# Patient Record
Sex: Female | Born: 1950 | Race: Black or African American | Hispanic: No | Smoking: Never smoker
Health system: Southern US, Community
[De-identification: ages and names within clinical notes are randomized; demographics above are authoritative.]

## PROBLEM LIST (undated history)

## (undated) DIAGNOSIS — D860 Sarcoidosis of lung: Secondary | ICD-10-CM

## (undated) DIAGNOSIS — E669 Obesity, unspecified: Secondary | ICD-10-CM

## (undated) DIAGNOSIS — I10 Essential (primary) hypertension: Secondary | ICD-10-CM

## (undated) HISTORY — DX: Obesity, unspecified: E66.9

## (undated) HISTORY — PX: TUBAL LIGATION: SHX77

## (undated) HISTORY — DX: Essential (primary) hypertension: I10

## (undated) HISTORY — DX: Sarcoidosis of lung: D86.0

---

## 2011-02-10 LAB — HM MAMMOGRAPHY: HM Mammogram: NORMAL

## 2011-02-10 LAB — HM COLONOSCOPY: HM Colonoscopy: NORMAL

## 2012-11-21 ENCOUNTER — Ambulatory Visit (INDEPENDENT_AMBULATORY_CARE_PROVIDER_SITE_OTHER): Payer: 59 | Admitting: Family Medicine

## 2012-11-21 ENCOUNTER — Encounter: Payer: Self-pay | Admitting: Family Medicine

## 2012-11-21 VITALS — BP 126/88 | HR 89 | Ht 62.0 in | Wt 198.0 lb

## 2012-11-21 DIAGNOSIS — I10 Essential (primary) hypertension: Secondary | ICD-10-CM

## 2012-11-21 DIAGNOSIS — M25569 Pain in unspecified knee: Secondary | ICD-10-CM

## 2012-11-21 DIAGNOSIS — Z23 Encounter for immunization: Secondary | ICD-10-CM

## 2012-11-21 DIAGNOSIS — M25562 Pain in left knee: Secondary | ICD-10-CM

## 2012-11-21 DIAGNOSIS — M79671 Pain in right foot: Secondary | ICD-10-CM

## 2012-11-21 DIAGNOSIS — Z2911 Encounter for prophylactic immunotherapy for respiratory syncytial virus (RSV): Secondary | ICD-10-CM

## 2012-11-21 DIAGNOSIS — D869 Sarcoidosis, unspecified: Secondary | ICD-10-CM

## 2012-11-21 DIAGNOSIS — M79609 Pain in unspecified limb: Secondary | ICD-10-CM

## 2012-11-21 HISTORY — DX: Sarcoidosis, unspecified: D86.9

## 2012-11-21 NOTE — Progress Notes (Signed)
  Subjective:    Patient ID: Terri Fitzgerald, female    DOB: 08/31/1950, 62 y.o.   MRN: [id number]  HPI She is here for a get acquainted visit. He has a history of sarcoidosis and presently is on inhalers. She was seen card to moving here and apparently this sarcoidosis in remission. She also has a history of hypertension and is on medications for this. She does complain of right heel pain and apparently had seen her orthopedic surgeon and given orthotics of however she says is not working. She also injured her left knee approximately 3 or 4 weeks ago and it occasionally gives her difficulty especially when she flexes. She states this is getting better.   Review of Systems     Objective:   Physical Exam Alert and in no distress. Exam of the right heel shows some slight tenderness to the medial lateral aspect of the calcaneus. Exam of the left knee shows no effusion, with full motion. No laxity noted. Negative McMurray's sign.       Assessment & Plan:  Sarcoidosis  Need for prophylactic vaccination and inoculation against influenza - Plan: Flu Vaccine QUAD 36+ mos IM, Pneumococcal polysaccharide vaccine 23-valent greater than or equal to 2yo subcutaneous/IM  Need for prophylactic vaccination and inoculation against unspecified single disease - Plan: Varicella-zoster vaccine subcutaneous  Hypertension  Heel pain, right  Knee pain, left  Her immunizations were updated. She will sign a release form. Recommend she use heel cup for her heel pain and no therapy for the knee since it is getting better. Continue on her blood pressure medications.

## 2012-12-19 ENCOUNTER — Other Ambulatory Visit: Payer: Self-pay

## 2012-12-19 MED ORDER — AMLODIPINE BESYLATE 5 MG PO TABS: 5.0000 mg | ORAL_TABLET | Freq: Every day | ORAL | Status: DC

## 2012-12-19 MED ORDER — VALSARTAN-HYDROCHLOROTHIAZIDE 320-25 MG PO TABS: 1.0000 | ORAL_TABLET | Freq: Every day | ORAL | Status: DC

## 2012-12-19 NOTE — Telephone Encounter (Signed)
PT CALLED AND LEFT MESSAGE THAT SHE NEEDED REFILL ON B/P MED AND THEY WERE SENT IN

## 2013-08-04 ENCOUNTER — Encounter: Payer: Self-pay | Admitting: Family Medicine

## 2013-08-04 ENCOUNTER — Ambulatory Visit (INDEPENDENT_AMBULATORY_CARE_PROVIDER_SITE_OTHER): Payer: 59 | Admitting: Family Medicine

## 2013-08-04 VITALS — BP 102/78 | Wt 207.0 lb

## 2013-08-04 DIAGNOSIS — E669 Obesity, unspecified: Secondary | ICD-10-CM

## 2013-08-04 DIAGNOSIS — I10 Essential (primary) hypertension: Secondary | ICD-10-CM

## 2013-08-04 DIAGNOSIS — Z23 Encounter for immunization: Secondary | ICD-10-CM

## 2013-08-04 DIAGNOSIS — D869 Sarcoidosis, unspecified: Secondary | ICD-10-CM

## 2013-08-04 LAB — COMPREHENSIVE METABOLIC PANEL
ALBUMIN: 4 g/dL (ref 3.5–5.2)
ALT: 24 U/L (ref 0–35)
AST: 20 U/L (ref 0–37)
Alkaline Phosphatase: 97 U/L (ref 39–117)
BUN: 16 mg/dL (ref 6–23)
CO2: 30 mEq/L (ref 19–32)
Calcium: 9.3 mg/dL (ref 8.4–10.5)
Chloride: 101 mEq/L (ref 96–112)
Creat: 0.92 mg/dL (ref 0.50–1.10)
Glucose, Bld: 111 mg/dL — ABNORMAL HIGH (ref 70–99)
POTASSIUM: 3.4 meq/L — AB (ref 3.5–5.3)
SODIUM: 141 meq/L (ref 135–145)
TOTAL PROTEIN: 7.5 g/dL (ref 6.0–8.3)
Total Bilirubin: 0.5 mg/dL (ref 0.2–1.2)

## 2013-08-04 LAB — CBC WITH DIFFERENTIAL/PLATELET
BASOS ABS: 0 10*3/uL (ref 0.0–0.1)
Basophils Relative: 0 % (ref 0–1)
EOS ABS: 0.3 10*3/uL (ref 0.0–0.7)
Eosinophils Relative: 3 % (ref 0–5)
HCT: 39.4 % (ref 36.0–46.0)
Hemoglobin: 13.9 g/dL (ref 12.0–15.0)
Lymphocytes Relative: 44 % (ref 12–46)
Lymphs Abs: 3.8 10*3/uL (ref 0.7–4.0)
MCH: 29.6 pg (ref 26.0–34.0)
MCHC: 35.3 g/dL (ref 30.0–36.0)
MCV: 84 fL (ref 78.0–100.0)
Monocytes Absolute: 0.7 10*3/uL (ref 0.1–1.0)
Monocytes Relative: 8 % (ref 3–12)
NEUTROS ABS: 3.9 10*3/uL (ref 1.7–7.7)
Neutrophils Relative %: 45 % (ref 43–77)
Platelets: 182 10*3/uL (ref 150–400)
RBC: 4.69 MIL/uL (ref 3.87–5.11)
RDW: 14.5 % (ref 11.5–15.5)
WBC: 8.6 10*3/uL (ref 4.0–10.5)

## 2013-08-04 LAB — LIPID PANEL
Cholesterol: 217 mg/dL — ABNORMAL HIGH (ref 0–200)
HDL: 65 mg/dL (ref >39)
LDL CALC: 96 mg/dL (ref 0–99)
TRIGLYCERIDES: 279 mg/dL — AB (ref <150)
Total CHOL/HDL Ratio: 3.3 Ratio
VLDL: 56 mg/dL — ABNORMAL HIGH (ref 0–40)

## 2013-08-04 MED ORDER — FLUTICASONE-SALMETEROL 500-50 MCG/DOSE IN AEPB: 1.0000 | INHALATION_SPRAY | Freq: Two times a day (BID) | RESPIRATORY_TRACT | Status: DC

## 2013-08-04 MED ORDER — ALBUTEROL SULFATE 108 (90 BASE) MCG/ACT IN AEPB: 2.0000 | INHALATION_SPRAY | Freq: Four times a day (QID) | RESPIRATORY_TRACT | Status: DC | PRN

## 2013-08-04 MED ORDER — VALSARTAN-HYDROCHLOROTHIAZIDE 320-25 MG PO TABS: 1.0000 | ORAL_TABLET | Freq: Every day | ORAL | Status: DC

## 2013-08-04 MED ORDER — AMLODIPINE BESYLATE 5 MG PO TABS: 5.0000 mg | ORAL_TABLET | Freq: Every day | ORAL | Status: DC

## 2013-08-04 NOTE — Progress Notes (Signed)
   Subjective:    Patient ID: Terri Fitzgerald, female    DOB: 06/29/1950, 63 y.o.   MRN: [id number]  HPI She is here for a recheck. She moved back to this area in July of last year and has been on her blood pressure medication but off of the asthma she has underlying history of sarcoid. She usually has more difficulty with her breathing and summer and fall. She would like to be placed back on her medication. She's had no difficulty with fever, chills, sore throat.   Review of Systems     Objective:   Physical Exam alert and in no distress. Tympanic membranes and canals are normal. Throat is clear. Tonsils are normal. Neck is supple without adenopathy or thyromegaly. Cardiac exam shows a regular sinus rhythm without murmurs or gallops. Lungs are clear to auscultation.        Assessment & Plan:  Essential hypertension - Plan: Fluticasone-Salmeterol (ADVAIR) 500-50 MCG/DOSE AEPB, amLODipine (NORVASC) 5 MG tablet, valsartan-hydrochlorothiazide (DIOVAN-HCT) 320-25 MG per tablet, CBC with Differential, Comprehensive metabolic panel, Lipid panel  Sarcoidosis - Plan: Albuterol Sulfate (PROAIR RESPICLICK) 108 (90 BASE) MCG/ACT AEPB, Pneumococcal conjugate vaccine 13-valent  Obesity (BMI 30-39.9) - Plan: CBC with Differential, Comprehensive metabolic panel, Lipid panel  I will place her back on all her medications. Get some blood work for general purposes since she has not had some done in quite some time. Also encouraged her to set up for complete examination.

## 2013-08-07 ENCOUNTER — Other Ambulatory Visit: Payer: Self-pay

## 2013-08-07 ENCOUNTER — Telehealth: Payer: Self-pay

## 2013-08-07 NOTE — Telephone Encounter (Signed)
THIS LOOKS LIKE dR.lALONDE FILLED THIS ON 08/04/13

## 2013-08-07 NOTE — Telephone Encounter (Signed)
Refill request for Advair Disk Inhaler 500/50

## 2013-09-11 ENCOUNTER — Encounter: Payer: Self-pay | Admitting: Family Medicine

## 2013-09-11 ENCOUNTER — Ambulatory Visit (INDEPENDENT_AMBULATORY_CARE_PROVIDER_SITE_OTHER): Payer: 59 | Admitting: Family Medicine

## 2013-09-11 VITALS — BP 120/80 | HR 74 | Ht 61.0 in | Wt 208.0 lb

## 2013-09-11 DIAGNOSIS — L309 Dermatitis, unspecified: Secondary | ICD-10-CM

## 2013-09-11 DIAGNOSIS — E669 Obesity, unspecified: Secondary | ICD-10-CM

## 2013-09-11 DIAGNOSIS — R040 Epistaxis: Secondary | ICD-10-CM

## 2013-09-11 DIAGNOSIS — Z0000 Encounter for general adult medical examination without abnormal findings: Secondary | ICD-10-CM

## 2013-09-11 DIAGNOSIS — Z23 Encounter for immunization: Secondary | ICD-10-CM

## 2013-09-11 DIAGNOSIS — D869 Sarcoidosis, unspecified: Secondary | ICD-10-CM

## 2013-09-11 DIAGNOSIS — E049 Nontoxic goiter, unspecified: Secondary | ICD-10-CM

## 2013-09-11 DIAGNOSIS — L259 Unspecified contact dermatitis, unspecified cause: Secondary | ICD-10-CM

## 2013-09-11 DIAGNOSIS — E010 Iodine-deficiency related diffuse (endemic) goiter: Secondary | ICD-10-CM

## 2013-09-11 DIAGNOSIS — I10 Essential (primary) hypertension: Secondary | ICD-10-CM

## 2013-09-11 NOTE — Patient Instructions (Signed)
Get back on [employer]

## 2013-09-11 NOTE — Progress Notes (Signed)
   Subjective:    Patient ID: Terri Fitzgerald, female    DOB: 03/03/1950, 63 y.o.   MRN: [id number]  HPI She is here for an interval evaluation. She does complain of intermittent difficulty with nosebleeds mainly on the right-hand side. She does have underlying allergies but at this point is not treating them. She would also like to be referred to dermatology. She does complain of dryness and scaling of her face as well as her ears. She does have underlying sarcoidosis and is followed regularly for this. She seems to be doing quite well on her present medication regimen. Her blood pressure is under good control. She does have a history of thyromegaly and states in the past she had had an evaluation including blood work and ultrasound which was negative. Her weight is up and she blames this on her lifestyle and the hot weather. She has been involved with Weight Wat[employer] in the past and did fairly well on it until she injured some stressful life events. Family and social history were reviewed. She is now retired and does help take care of her grandchild. She is scheduled to see her gynecologist today.   Review of Systems  All other systems reviewed and are negative.      Objective:   Physical Exam alert and in no distress. Tympanic membranes and canals are normal. Throat is clear. Nasal exam does show erythema in the right nares medially Tonsils are normal. Neck is supple without adenopathy or thyromegaly. Cardiac exam shows a regular sinus rhythm without murmurs or gallops. Lungs are clear to auscultation. Exam of the face does show some slight drying and thickening of the skin and also noted in the ear lobes.        Assessment & Plan:  Epistaxis  Dermatitis - Plan: Ambulatory referral to Dermatology  Obesity (BMI 30-39.9)  Sarcoidosis  Essential hypertension  Need for Tdap vaccination - Plan: Tdap vaccine greater than or equal to 7yo IM  Thyromegaly - Plan: TSH  Routine general  medical examination at a health care facility - Plan: POCT Urinalysis Dipstick  I will check a TSH. Do not feel the need to do an ultrasound of her thyroid. Her immunizations were updated. Discussed diet and exercise with her. Strongly encouraged her to get back with Weight Watchers since she did fairly well on that. Explained that the next time she has a nosebleed to let me look to see if there is anything that needs to be cauterized.

## 2013-09-12 LAB — TSH: TSH: 1.115 u[IU]/mL (ref 0.350–4.500)

## 2013-09-22 ENCOUNTER — Other Ambulatory Visit: Payer: Self-pay | Admitting: Obstetrics and Gynecology

## 2013-09-22 DIAGNOSIS — Z1231 Encounter for screening mammogram for malignant neoplasm of breast: Secondary | ICD-10-CM

## 2013-09-26 ENCOUNTER — Ambulatory Visit
Admission: RE | Admit: 2013-09-26 | Discharge: 2013-09-26 | Disposition: A | Discharge status: Home / Self Care | Payer: 59 | Source: Ambulatory Visit | Attending: Obstetrics and Gynecology | Admitting: Obstetrics and Gynecology

## 2013-09-26 DIAGNOSIS — Z1231 Encounter for screening mammogram for malignant neoplasm of breast: Secondary | ICD-10-CM

## 2014-04-02 ENCOUNTER — Other Ambulatory Visit: Payer: Self-pay | Admitting: Family Medicine

## 2014-08-29 ENCOUNTER — Other Ambulatory Visit: Payer: Self-pay | Admitting: Family Medicine

## 2014-08-29 DIAGNOSIS — Z1231 Encounter for screening mammogram for malignant neoplasm of breast: Secondary | ICD-10-CM

## 2014-10-03 ENCOUNTER — Ambulatory Visit
Admission: RE | Admit: 2014-10-03 | Discharge: 2014-10-03 | Disposition: A | Discharge status: Home / Self Care | Payer: 59 | Source: Ambulatory Visit | Attending: Family Medicine | Admitting: Family Medicine

## 2014-10-03 DIAGNOSIS — Z1231 Encounter for screening mammogram for malignant neoplasm of breast: Secondary | ICD-10-CM

## 2014-10-18 ENCOUNTER — Encounter: Payer: Self-pay | Admitting: Family Medicine

## 2014-10-18 ENCOUNTER — Ambulatory Visit (INDEPENDENT_AMBULATORY_CARE_PROVIDER_SITE_OTHER): Payer: PRIVATE HEALTH INSURANCE | Admitting: Family Medicine

## 2014-10-18 VITALS — BP 122/76 | HR 92 | Temp 98.5°F | Resp 14 | Ht 62.0 in | Wt 202.2 lb

## 2014-10-18 DIAGNOSIS — I10 Essential (primary) hypertension: Secondary | ICD-10-CM

## 2014-10-18 DIAGNOSIS — Z0000 Encounter for general adult medical examination without abnormal findings: Secondary | ICD-10-CM

## 2014-10-18 DIAGNOSIS — E669 Obesity, unspecified: Secondary | ICD-10-CM | POA: Diagnosis not present

## 2014-10-18 DIAGNOSIS — Z1159 Encounter for screening for other viral diseases: Secondary | ICD-10-CM

## 2014-10-18 DIAGNOSIS — Z23 Encounter for immunization: Secondary | ICD-10-CM | POA: Diagnosis not present

## 2014-10-18 DIAGNOSIS — D869 Sarcoidosis, unspecified: Secondary | ICD-10-CM

## 2014-10-18 LAB — LIPID PANEL
CHOL/HDL RATIO: 3 ratio (ref <=5.0)
CHOLESTEROL: 253 mg/dL — AB (ref 125–200)
HDL: 83 mg/dL (ref >=46)
LDL Cholesterol: 147 mg/dL — ABNORMAL HIGH (ref <130)
Triglycerides: 115 mg/dL (ref <150)
VLDL: 23 mg/dL (ref <30)

## 2014-10-18 LAB — CBC WITH DIFFERENTIAL/PLATELET
BASOS ABS: 0 10*3/uL (ref 0.0–0.1)
Basophils Relative: 0 % (ref 0–1)
EOS PCT: 2 % (ref 0–5)
Eosinophils Absolute: 0.1 10*3/uL (ref 0.0–0.7)
HEMATOCRIT: 42.6 % (ref 36.0–46.0)
Hemoglobin: 14.7 g/dL (ref 12.0–15.0)
LYMPHS ABS: 2.4 10*3/uL (ref 0.7–4.0)
LYMPHS PCT: 34 % (ref 12–46)
MCH: 30.2 pg (ref 26.0–34.0)
MCHC: 34.5 g/dL (ref 30.0–36.0)
MCV: 87.5 fL (ref 78.0–100.0)
MPV: 11.6 fL (ref 8.6–12.4)
Monocytes Absolute: 0.4 10*3/uL (ref 0.1–1.0)
Monocytes Relative: 6 % (ref 3–12)
NEUTROS ABS: 4.2 10*3/uL (ref 1.7–7.7)
NEUTROS PCT: 58 % (ref 43–77)
Platelets: 211 10*3/uL (ref 150–400)
RBC: 4.87 MIL/uL (ref 3.87–5.11)
RDW: 14.3 % (ref 11.5–15.5)
WBC: 7.2 10*3/uL (ref 4.0–10.5)

## 2014-10-18 LAB — COMPREHENSIVE METABOLIC PANEL
ALK PHOS: 79 U/L (ref 33–130)
ALT: 23 U/L (ref 6–29)
AST: 20 U/L (ref 10–35)
Albumin: 4.4 g/dL (ref 3.6–5.1)
BILIRUBIN TOTAL: 0.6 mg/dL (ref 0.2–1.2)
BUN: 17 mg/dL (ref 7–25)
CALCIUM: 10 mg/dL (ref 8.6–10.4)
CO2: 27 mmol/L (ref 20–31)
Chloride: 102 mmol/L (ref 98–110)
Creat: 0.85 mg/dL (ref 0.50–0.99)
GLUCOSE: 125 mg/dL — AB (ref 65–99)
POTASSIUM: 3.5 mmol/L (ref 3.5–5.3)
Sodium: 139 mmol/L (ref 135–146)
TOTAL PROTEIN: 8.1 g/dL (ref 6.1–8.1)

## 2014-10-18 MED ORDER — VALSARTAN-HYDROCHLOROTHIAZIDE 320-25 MG PO TABS: 1.0000 | ORAL_TABLET | Freq: Every day | ORAL | Status: DC

## 2014-10-18 MED ORDER — AMLODIPINE BESYLATE 5 MG PO TABS: 5.0000 mg | ORAL_TABLET | Freq: Every day | ORAL | Status: DC

## 2014-10-18 NOTE — Progress Notes (Signed)
Subjective:    Patient ID: Terri Fitzgerald, female    DOB: [birth year], 64 y.o.   MRN: [id number]  HPI She is here for complete examination. She has a two-week history of chest congestion and coughing and is concerned over reoccurrence of sarcoid. She's had no fever, chills, sore throat or earache. She was diagnosed in the mid 90s with sarcoid and apparently did have one episode after that. It did require her being on steroid for a period of time. She has not had any evaluation on this in several years. She was placed on steroid inhalers as well as albuterol but has not used this in over a year. She does have underlying hypertension and continues on medicine for this. She has seen her gynecologist and did have a Pap done last year.She is now retired and is helping to take care of her grandchildren. She does have some issues with weight and does tend to fluctuate. She does plan to get involved in an exercise program. She has no other concerns or complaints. Family and social history as well as health maintenance was reviewed.   Review of Systems  All other systems reviewed and are negative.      Objective:   Physical Exam BP 122/76 mmHg  Pulse 92  Temp(Src) 98.5 F (36.9 C) (Oral)  Resp 14  Ht  (1.575 m)  Wt 202 lb 3.2 oz (91.717 kg)  BMI 36.97 kg/m2  General Appearance:    Alert, cooperative, no distress, appears stated age  Head:    Normocephalic, without obvious abnormality, atraumatic  Eyes:    PERRL, conjunctiva/corneas clear, EOM's intact, fundi    benign  Ears:    Normal TM's and external ear canals  Nose:   Nares normal, mucosa normal, no drainage or sinus   tenderness  Throat:   Lips, mucosa, and tongue normal; teeth and gums normal  Neck:   Supple, no lymphadenopathy;  thyroid:  no   enlargement/tenderness/nodules; no carotid   bruit or JVD  Back:    Spine nontender, no curvature, ROM normal, no CVA     tenderness  Lungs:     Clear to auscultation bilaterally without  wheezes, rales or     ronchi; respirations unlabored  Chest Wall:    No tenderness or deformity   Heart:    Regular rate and rhythm, S1 and S2 normal, no murmur, rub   or gallop  Breast Exam:    Deferred to GYN  Abdomen:     Soft, non-tender, nondistended, normoactive bowel sounds,    no masses, no hepatosplenomegaly  Genitalia:    Deferred to GYN     Extremities:   No clubbing, cyanosis or edema  Pulses:   2+ and symmetric all extremities  Skin:   Skin color, texture, turgor normal, no rashes or lesions  Lymph nodes:   Cervical, supraclavicular, and axillary nodes normal  Neurologic:   CNII-XII intact, normal strength, sensation and gait; reflexes 2+ and symmetric throughout          Psych:   Normal mood, affect, hygiene and grooming.          Assessment & Plan:  Routine general medical examination at a health care facility - Plan: CBC with Differential/Platelet, Comprehensive metabolic panel, Lipid panel, Hepatitis C Antibody  Sarcoidosis - Plan: DG Chest 2 View  Essential hypertension - Plan: valsartan-hydrochlorothiazide (DIOVAN-HCT) 320-25 MG per tablet, amLODipine (NORVASC) 5 MG tablet, CBC with Differential/Platelet  Need for prophylactic vaccination and inoculation  against influenza - Plan: Flu Vaccine QUAD 36+ mos IM  Need for hepatitis C screening test  Obesity I will get an x-ray to evaluate for sarcoid.i encouraged her to become physically active.

## 2014-10-19 ENCOUNTER — Ambulatory Visit
Admission: RE | Admit: 2014-10-19 | Discharge: 2014-10-19 | Disposition: A | Discharge status: Home / Self Care | Payer: PRIVATE HEALTH INSURANCE | Source: Ambulatory Visit | Attending: Family Medicine | Admitting: Family Medicine

## 2014-10-19 DIAGNOSIS — D869 Sarcoidosis, unspecified: Secondary | ICD-10-CM

## 2014-10-19 LAB — HEPATITIS C ANTIBODY: HCV AB: NEGATIVE

## 2014-10-22 ENCOUNTER — Other Ambulatory Visit: Payer: Self-pay

## 2014-10-22 DIAGNOSIS — D869 Sarcoidosis, unspecified: Secondary | ICD-10-CM

## 2014-11-19 ENCOUNTER — Encounter: Payer: Self-pay | Admitting: Pulmonary Disease

## 2014-11-19 ENCOUNTER — Ambulatory Visit (INDEPENDENT_AMBULATORY_CARE_PROVIDER_SITE_OTHER): Payer: 59 | Admitting: Pulmonary Disease

## 2014-11-19 ENCOUNTER — Other Ambulatory Visit (INDEPENDENT_AMBULATORY_CARE_PROVIDER_SITE_OTHER): Payer: 59

## 2014-11-19 VITALS — BP 126/72 | HR 78 | Temp 99.6°F | Ht 61.0 in | Wt 204.8 lb

## 2014-11-19 DIAGNOSIS — I10 Essential (primary) hypertension: Secondary | ICD-10-CM | POA: Diagnosis not present

## 2014-11-19 DIAGNOSIS — D869 Sarcoidosis, unspecified: Secondary | ICD-10-CM

## 2014-11-19 LAB — URINALYSIS, ROUTINE W REFLEX MICROSCOPIC
BILIRUBIN URINE: NEGATIVE
HGB URINE DIPSTICK: NEGATIVE
Ketones, ur: NEGATIVE
Nitrite: NEGATIVE
RBC / HPF: NONE SEEN (ref 0–?)
Specific Gravity, Urine: 1.025 (ref 1.000–1.030)
Total Protein, Urine: NEGATIVE
UROBILINOGEN UA: 0.2 (ref 0.0–1.0)
Urine Glucose: NEGATIVE
pH: 6 (ref 5.0–8.0)

## 2014-11-19 NOTE — Patient Instructions (Signed)
We will order some blood tests and urine tests We will order an electrocardiogram Schedule you for a lung function tests  Return to clinic in 6 months

## 2014-11-19 NOTE — Progress Notes (Signed)
Subjective:    Patient ID: Terri Fitzgerald, female    DOB: 08-Sep-1950, 64 y.o.   MRN: [id number]  HPI Consult for management of sarcoidosis.  Terri Fitzgerald is a 64 year old with diagnosis of pulmonary sarcoidosis. This was diagnosed by transbronchial biopsy in [place] around 1990. She has been followed by a pulmonologist there and has been on and off prednisone. She is stopped taking the prednisone about 5 years ago due to an improvement in her symptoms. She states that she feels well currently. Denies any shortness of breath, chest pain, cough, sputum production, palpitations. She's asked her primary care to refer her to a pulmonary specialist for routine follow-up. She does not recall the name of the pulmonologist at [facility].  She has been prescribed Advair and albuterol. But she does not use this as she is asymptomatic. She does not have any extrapulmonary manifestations of sarcoid. She is scheduled to have ophthalmic checkup next week. She's had a routine chest x-ray and labs including BMP, LFTs, CBC last month. The labs were within normal limits however the chest x-ray shows bilateral opacities. I do not have an older x-ray to compare.  Patient Active Problem List   Diagnosis Date Noted  . Sarcoidosis (HCC) 11/21/2012  . Hypertension 11/21/2012    Current outpatient prescriptions:  .  amLODipine (NORVASC) 5 MG tablet, Take 1 tablet (5 mg total) by mouth daily., Disp: 90 tablet, Rfl: 3 .  Biotin 1000 MCG tablet, Take 1,000 mcg by mouth daily., Disp: , Rfl:  .  Fluticasone-Salmeterol (ADVAIR) 500-50 MCG/DOSE AEPB, Inhale 1 puff into the lungs every 12 (twelve) hours., Disp: 60 each, Rfl: 11 .  hydrocortisone 2.5 % cream, APPLY TO AFFECTED AREA TWICE A DAY AS NEEDED, Disp: , Rfl: 2 .  valsartan-hydrochlorothiazide (DIOVAN-HCT) 320-25 MG per tablet, Take 1 tablet by mouth daily., Disp: 90 tablet, Rfl: 3 .  Albuterol Sulfate (PROAIR RESPICLICK) 108 (90 BASE) MCG/ACT AEPB, Inhale 2  puffs into the lungs 4 (four) times daily as needed. (Patient not taking: Reported on 10/18/2014), Disp: 1 each, Rfl: 1   Blood pressure 126/72, pulse 78, temperature 99.6 F (37.6 C), temperature source Oral, height  (1.549 m), weight 204 lb 12.8 oz (92.897 kg), SpO2 98 %.  Review of Systems Denies any cough, dyspnea, sputum production, hemoptysis. Denies any fevers, chills, malaise, weight loss. Denies any chest pain, palpitations. Denies any nausea, vomiting, diarrhea. All other review of systems are negative.    Objective:   Physical Exam  Gen.: Pleasant elderly female. No apparent distress Neuro: No gross focal deficits. Neck: No JVD, lymphadenopathy, thyromegaly. RS: Clear antr. CVS: S1-S2 heard, no murmurs rubs gallops. Abdomen: Soft, positive bowel sounds. Extremities: No edema.    Assessment & Plan:   #1 Pulmonary sarcoidosis. Diagnosis by transbronchial biopsy in 1990. She had previously been on prednisone but has been off it for the past 5 years. Her chest x-ray shows bilateral opacities however she is completely asymptomatic and is feeling well.  I will get a set of baseline labs including afebrile, urine calcium and EKG. I'll also order PFTs for evaluation. She is asymptomatic I don't believe she'll require any treatment with prednisone at present. However will continue to follow along with regular monitoring.  Plan: - Check urine calcium, ACE level - EKG and PFTs - Opthal check up (already scheduled)  Will continue to monitor and return to clinic in 6 months.  Chilton Greathouse MD [facility] Pager  [phone] If no answer or after 3pm call: [phone] 11/19/2014, 11:43 AM

## 2014-11-20 LAB — ANGIOTENSIN CONVERTING ENZYME: Angiotensin-Converting Enzyme: 32 U/L (ref 8–52)

## 2015-03-14 ENCOUNTER — Other Ambulatory Visit: Payer: Self-pay | Admitting: Family Medicine

## 2015-05-31 ENCOUNTER — Telehealth: Payer: Self-pay | Admitting: Family Medicine

## 2015-05-31 NOTE — Telephone Encounter (Signed)
Received signed medical records request to [facility] [employer]. Records faxed to [phone].

## 2015-06-05 ENCOUNTER — Telehealth: Payer: Self-pay | Admitting: Family Medicine

## 2015-06-05 DIAGNOSIS — Z0279 Encounter for issue of other medical certificate: Secondary | ICD-10-CM

## 2015-06-05 NOTE — Telephone Encounter (Signed)
[facility]  On behalf of [employer]. Records faxed to [phone]

## 2015-06-10 ENCOUNTER — Ambulatory Visit (INDEPENDENT_AMBULATORY_CARE_PROVIDER_SITE_OTHER): Payer: 59 | Admitting: Pulmonary Disease

## 2015-06-10 ENCOUNTER — Ambulatory Visit (INDEPENDENT_AMBULATORY_CARE_PROVIDER_SITE_OTHER)
Admission: RE | Admit: 2015-06-10 | Discharge: 2015-06-10 | Disposition: A | Discharge status: Home / Self Care | Payer: 59 | Source: Ambulatory Visit | Attending: Pulmonary Disease | Admitting: Pulmonary Disease

## 2015-06-10 ENCOUNTER — Encounter: Payer: Self-pay | Admitting: Pulmonary Disease

## 2015-06-10 VITALS — BP 132/74 | HR 95 | Ht 62.0 in | Wt 200.0 lb

## 2015-06-10 DIAGNOSIS — D869 Sarcoidosis, unspecified: Secondary | ICD-10-CM | POA: Diagnosis not present

## 2015-06-10 LAB — PULMONARY FUNCTION TEST
DL/VA % pred: 92 %
DL/VA: 4.22 ml/min/mmHg/L
DLCO UNC: 14.8 ml/min/mmHg
DLCO cor % pred: 65 %
DLCO cor: 14.15 ml/min/mmHg
DLCO unc % pred: 68 %
FEF 25-75 POST: 3.39 L/s
FEF 25-75 Pre: 3.19 L/sec
FEF2575-%Change-Post: 6 %
FEF2575-%Pred-Post: 193 %
FEF2575-%Pred-Pre: 182 %
FEV1-%CHANGE-POST: 0 %
FEV1-%PRED-POST: 106 %
FEV1-%Pred-Pre: 106 %
FEV1-POST: 1.9 L
FEV1-PRE: 1.9 L
FEV1FVC-%CHANGE-POST: 1 %
FEV1FVC-%Pred-Pre: 117 %
FEV6-%Change-Post: -1 %
FEV6-%PRED-POST: 92 %
FEV6-%Pred-Pre: 94 %
FEV6-PRE: 2.08 L
FEV6-Post: 2.05 L
FEV6FVC-%PRED-POST: 104 %
FEV6FVC-%PRED-PRE: 104 %
FVC-%CHANGE-POST: -1 %
FVC-%PRED-POST: 89 %
FVC-%PRED-PRE: 90 %
FVC-POST: 2.05 L
FVC-Pre: 2.08 L
POST FEV1/FVC RATIO: 93 %
POST FEV6/FVC RATIO: 100 %
PRE FEV6/FVC RATIO: 100 %
Pre FEV1/FVC ratio: 91 %
RV % PRED: 70 %
RV: 1.39 L
TLC % PRED: 73 %
TLC: 3.48 L

## 2015-06-10 NOTE — Progress Notes (Signed)
Subjective:    Patient ID: Terri Fitzgerald, female    DOB: 04/28/1950, 65 y.o.   MRN: [id number]  HPI Follow-up for management of sarcoidosis.  Terri Fitzgerald is a 65 year old with diagnosis of pulmonary sarcoidosis. This was [place] 1990. She has been followed by a pulmonologist there and has been on and off prednisone. She is stopped taking the prednisone about 5 years ago due to an improvement in her symptoms. She states that she feels well currently. Denies any shortness of breath, chest pain, cough, sputum production, palpitations. She's asked her primary care to refer her to a pulmonary specialist for routine follow-up. She [facility]  She has been prescribed Advair and albuterol. But she does not use this as she is asymptomatic. She does not have any extrapulmonary manifestations of sarcoid. She was scheduled to have an ophthalmic checkup but had to cancel it. She is in the process of rescheduling this.  DATA: ACE levels 11/19/14 32 (normal)  PFTs 06/10/15 FVC 2.08 (90%) FEV1 1.90 [106%] F/F 91 TLC 73% DLCO 60%.  CXR 06/10/15 Chronic changes without acute abnormality.   EKG 11/19/14- NSR, no acute changes  Patient Active Problem List   Diagnosis Date Noted  . Sarcoidosis (HCC) 11/21/2012  . Hypertension 11/21/2012    Current outpatient prescriptions:  .  Albuterol Sulfate (PROAIR RESPICLICK) 108 (90 BASE) MCG/ACT AEPB, Inhale 2 puffs into the lungs 4 (four) times daily as needed., Disp: 1 each, Rfl: 1 .  amLODipine (NORVASC) 5 MG tablet, TAKE 1 TABLET DAILY, Disp: 90 tablet, Rfl: 1 .  Biotin 1000 MCG tablet, Take 1,000 mcg by mouth daily., Disp: , Rfl:  .  hydrocortisone 2.5 % cream, APPLY TO AFFECTED AREA TWICE A DAY AS NEEDED, Disp: , Rfl: 2 .  valsartan-hydrochlorothiazide (DIOVAN-HCT) 320-25 MG tablet, TAKE 1 TABLET DAILY, Disp: 90 tablet, Rfl: 1 .  Fluticasone-Salmeterol  (ADVAIR) 500-50 MCG/DOSE AEPB, Inhale 1 puff into the lungs every 12 (twelve) hours. (Patient not taking: Reported on 06/10/2015), Disp: 60 each, Rfl: 11   Blood pressure 126/72, pulse 78, temperature 99.6 F (37.6 C), temperature source Oral, height 5\' 1"  (1.549 m), weight 204 lb 12.8 oz (92.897 kg), SpO2 98 %.  Review of Systems Denies any cough, dyspnea, sputum production, hemoptysis. Denies any fevers, chills, malaise, weight loss. Denies any chest pain, palpitations. Denies any nausea, vomiting, diarrhea. All other review of systems are negative.    Objective:   Physical Exam  Blood pressure 132/74, pulse 95, height 5\' 2"  (1.575 m), weight 200 lb (90.719 kg), SpO2 100 %. Gen.: Pleasant elderly female. No apparent distress Neuro: No gross focal deficits. Neck: No JVD, lymphadenopathy, thyromegaly. RS: Clear antr. CVS: S1-S2 heard, no murmurs rubs gallops. Abdomen: Soft, positive bowel sounds. Extremities: No edema.    Assessment & Plan:  #1 Pulmonary sarcoidosis. Diagnosis by transbronchial biopsy in 1990. She had previously been on prednisone but has been off it for the past 5 years. Her chest x-ray shows chronic stable fibrosis. But she is completely asymptomatic and is feeling well. Labs including BMP, LFTs, CBC were within normal limits and ACE level is also normal. EKG is unremarkable. PFTs shows restriction and DLCO reduction c/w with her sarcoidosis.  Her sarcoidosis is not active at present and I don't believe she'll require any treatment with prednisone or other therapies. However will continue to follow along with regular monitoring. She is  working on rescheduling her opthal appointment.   Plan: - Continued follow up - Opthal check up reschedule  Will continue to monitor and return to clinic in 6 months.  Chilton Greathouse MD [facility] Pager [phone] If no answer or after 3pm call: [phone] 06/10/2015, 10:31 AM

## 2015-06-10 NOTE — Progress Notes (Signed)
PFT done today. 

## 2015-06-10 NOTE — Patient Instructions (Addendum)
We will get a chest x-ray today. Please make sure you have an ophthalmology appointment rescheduled.  Return to clinic in 6 months.

## 2015-06-11 NOTE — Progress Notes (Signed)
Quick Note:  Patient returned call. Advised of cxr results / recs as stated by PM. Pt verbalized understanding and denied any questions. ______

## 2015-06-11 NOTE — Progress Notes (Signed)
Quick Note:  LM w/ family member TCB x1 ______

## 2015-06-18 ENCOUNTER — Telehealth: Payer: Self-pay | Admitting: Pulmonary Disease

## 2015-06-18 NOTE — Telephone Encounter (Signed)
Spoke with patient, advised her that I am not able to fax the records to her due to HIPPA laws, but that I would be happy to mail the copies to her.  Patient verified mailing address. Copies printed and mailed to verified mailing address. Nothing further needed.

## 2015-08-16 ENCOUNTER — Other Ambulatory Visit: Payer: Self-pay

## 2015-08-16 ENCOUNTER — Telehealth: Payer: Self-pay

## 2015-08-16 MED ORDER — AMLODIPINE BESYLATE 5 MG PO TABS: 5.0000 mg | ORAL_TABLET | Freq: Every day | ORAL | Status: DC

## 2015-08-16 NOTE — Telephone Encounter (Signed)
Med sent in.

## 2015-08-16 NOTE — Telephone Encounter (Signed)
Faxed request rcvd for amlodipine 5mg  90 day sent to [facility]. Trixie Rude/RLB

## 2015-09-06 ENCOUNTER — Telehealth: Payer: Self-pay | Admitting: Pulmonary Disease

## 2015-09-06 DIAGNOSIS — I10 Essential (primary) hypertension: Secondary | ICD-10-CM

## 2015-09-06 DIAGNOSIS — D869 Sarcoidosis, unspecified: Secondary | ICD-10-CM

## 2015-09-06 MED ORDER — FLUTICASONE-SALMETEROL 500-50 MCG/DOSE IN AEPB: 1.0000 | INHALATION_SPRAY | Freq: Two times a day (BID) | RESPIRATORY_TRACT | 5 refills | Status: DC

## 2015-09-06 MED ORDER — ALBUTEROL SULFATE 108 (90 BASE) MCG/ACT IN AEPB: 2.0000 | INHALATION_SPRAY | Freq: Four times a day (QID) | RESPIRATORY_TRACT | 5 refills | Status: DC | PRN

## 2015-09-06 NOTE — Telephone Encounter (Signed)
Spoke with pt. She needs refills on Proair and Advair. These have been sent in. Nothing further was needed.

## 2015-10-07 ENCOUNTER — Ambulatory Visit: Payer: 59 | Admitting: Pulmonary Disease

## 2015-10-07 ENCOUNTER — Ambulatory Visit (INDEPENDENT_AMBULATORY_CARE_PROVIDER_SITE_OTHER): Payer: Medicare Other | Admitting: Pulmonary Disease

## 2015-10-07 ENCOUNTER — Encounter: Payer: Self-pay | Admitting: Pulmonary Disease

## 2015-10-07 ENCOUNTER — Ambulatory Visit (HOSPITAL_COMMUNITY)
Admission: RE | Admit: 2015-10-07 | Discharge: 2015-10-07 | Disposition: A | Discharge status: Home / Self Care | Payer: Medicare Other | Source: Ambulatory Visit | Attending: Pulmonary Disease | Admitting: Pulmonary Disease

## 2015-10-07 ENCOUNTER — Other Ambulatory Visit: Payer: Medicare Other

## 2015-10-07 VITALS — BP 134/72 | HR 92 | Ht 61.0 in | Wt 205.2 lb

## 2015-10-07 DIAGNOSIS — R600 Localized edema: Secondary | ICD-10-CM | POA: Insufficient documentation

## 2015-10-07 DIAGNOSIS — R0600 Dyspnea, unspecified: Secondary | ICD-10-CM | POA: Insufficient documentation

## 2015-10-07 DIAGNOSIS — E669 Obesity, unspecified: Secondary | ICD-10-CM | POA: Diagnosis not present

## 2015-10-07 DIAGNOSIS — I10 Essential (primary) hypertension: Secondary | ICD-10-CM | POA: Insufficient documentation

## 2015-10-07 LAB — D-DIMER, QUANTITATIVE: D-Dimer, Quant: 0.24 mcg/mL FEU (ref <0.50)

## 2015-10-07 NOTE — Patient Instructions (Signed)
We will schedule for lower extremity Doppler ultrasound to evaluate for blood clots. We will send her down for lab tests to check for d-dimer levels.  I will call you if there is any abnormality for further follow-up. Otherwise I will see you in 6 months for a routine visit.

## 2015-10-07 NOTE — Progress Notes (Signed)
Terri Fitzgerald    [id number]    05/15/1950  Primary Care Physician:LALONDE,JOHN Leonette MostHARLES, MD  Referring Physician: Ronnald NianJohn C Lalonde, MD [address] [place], [place] [id number]  Chief complaint:  Follow up for management of sarcoidosis  HPI: Terri Fitzgerald is a 65 year old with diagnosis of pulmonary sarcoidosis. This was [place]. She has been followed by a pulmonologist there and has been on and off prednisone. She is stopped taking the prednisone about 5 years ago due to an improvement in her symptoms. She states that she feels well currently. Denies any shortness of breath, chest pain, cough, sputum production, palpitations. She's asked her primary care to refer her to a pulmonary specialist for routine follow-up. She does not recall the [facility].  She has been prescribed Advair and albuterol. But she does not use this as she is asymptomatic. She does not have any extrapulmonary manifestations of sarcoid. She is scheduled to have ophthalmic checkup next week. She's had a routine chest x-ray and labs including BMP, LFTs, CBC last month. The labs were within normal limits however the chest x-ray shows bilateral opacities. I do not have an older x-ray to compare.  [place] [facility] [place] car trip for family reunion in July 2017. She has noticed intermittent bilateral leg swelling after that. She denies any worsening of her dyspnea. She denies any chest pain, palpitations.  Outpatient Encounter Prescriptions as of 10/07/2015  Medication Sig  . Albuterol Sulfate (PROAIR RESPICLICK) 108 (90 Base) MCG/ACT AEPB Inhale 2 puffs into the lungs 4 (four) times daily as needed.  Marland Kitchen. amLODipine (NORVASC) 5 MG tablet Take 1 tablet (5 mg total) by mouth daily.  . Biotin 1000 MCG tablet Take 1,000 mcg by mouth daily.  . Fluticasone-Salmeterol (ADVAIR) 500-50 MCG/DOSE AEPB Inhale 1 puff into the lungs every 12  (twelve) hours.  . hydrocortisone 2.5 % cream APPLY TO AFFECTED AREA TWICE A DAY AS NEEDED  . valsartan-hydrochlorothiazide (DIOVAN-HCT) 320-25 MG tablet TAKE 1 TABLET DAILY   No facility-administered encounter medications on file as of 10/07/2015.     Allergies as of 10/07/2015  . (No Known Allergies)    Past Medical History:  Diagnosis Date  . Hypertension     History reviewed. No pertinent surgical history.  Family History  Problem Relation Age of Onset  . Asthma Maternal Grandmother   . Prostate cancer Brother     Social History   Social History  . Marital status: Widowed    Spouse name: N/A  . Number of children: N/A  . Years of education: N/A   Occupational History  . Not on file.   Social History Main Topics  . Smoking status: Never Smoker  . Smokeless tobacco: Never Used  . Alcohol use Yes     Comment: Rare  . Drug use: No  . Sexual activity: Not Currently   Other Topics Concern  . Not on file   Social History Narrative  . No narrative on file    Review of systems: Review of Systems  Constitutional: Negative for fever and chills.  HENT: Negative.   Eyes: Negative for blurred vision.  Respiratory: as per HPI  Cardiovascular: Negative for chest pain and palpitations.  Gastrointestinal: Negative for vomiting, diarrhea, blood per rectum. Genitourinary: Negative for dysuria, urgency, frequency and hematuria.  Musculoskeletal: Negative for myalgias, back pain and joint pain. LE  edema  Skin: Negative for itching and rash.  Neurological: Negative for dizziness, tremors, focal weakness, seizures and loss of consciousness.  Endo/Heme/Allergies: Negative for environmental allergies.  Psychiatric/Behavioral: Negative for depression, suicidal ideas and hallucinations.  All other systems reviewed and are negative.   Physical Exam: Blood pressure 134/72, pulse 92, height 5\' 1"  (1.549 m), weight 205 lb 3.2 oz (93.1 kg), SpO2 97 %. Gen:      No acute  distress HEENT:  EOMI, sclera anicteric Neck:     No masses; no thyromegaly Lungs:    Clear to auscultation bilaterally; normal respiratory effort CV:         Regular rate and rhythm; no murmurs Abd:      + bowel sounds; soft, non-tender; no palpable masses, no distension Ext:   1+ peripheral edema; adequate peripheral perfusion Skin:      Warm and dry; no rash Neuro: alert and oriented x 3 Psych: normal mood and affect  Data Reviewed: ACE levels 11/19/14 32 (normal)  PFTs 06/10/15 FVC 2.08 (90%) FEV1 1.90 [106%] F/F 91 TLC 73% DLCO 60%.  CXR 06/10/15 Chronic changes without acute abnormality.   EKG 11/19/14- NSR, no acute changes  Assessment:  #1 Pulmonary sarcoidosis. Diagnosis by transbronchial biopsy in 1990. She had previously been on prednisone but has been off it for the past 5 years. Her chest x-ray shows chronic stable fibrosis. But she is completely asymptomatic and is feeling well. Labs including BMP, LFTs, CBC were within normal limits and ACE level is also normal. EKG is unremarkable. PFTs shows restriction and DLCO reduction c/w with her sarcoidosis.  Her sarcoidosis is not active at present and I don't believe she'll require any treatment with prednisone or other therapies. However will continue to follow along with regular monitoring. She had a recent normal opthal examination.  #2 LE edema She has noticed bilateral lower extremity edema after recent car trip. She does not have any chest pain, dyspnea suggesting of an acute PE. I will evaluate this further with a lower extremity Doppler and d-dimer.  Plan/Recommendations: - LE dopplers, D dimer  Chilton Greathouse MD [facility] Pager [phone] 10/07/2015, 10:55 AM  CC: Ronnald Nian, MD

## 2015-10-09 DIAGNOSIS — Z01419 Encounter for gynecological examination (general) (routine) without abnormal findings: Secondary | ICD-10-CM | POA: Diagnosis not present

## 2015-10-09 DIAGNOSIS — Z1231 Encounter for screening mammogram for malignant neoplasm of breast: Secondary | ICD-10-CM | POA: Diagnosis not present

## 2015-10-25 ENCOUNTER — Ambulatory Visit (INDEPENDENT_AMBULATORY_CARE_PROVIDER_SITE_OTHER): Payer: Medicare Other | Admitting: Medical

## 2015-10-25 ENCOUNTER — Encounter: Payer: Self-pay | Admitting: Medical

## 2015-10-25 VITALS — BP 124/82 | HR 100 | Temp 98.7°F | Wt 206.2 lb

## 2015-10-25 DIAGNOSIS — M79669 Pain in unspecified lower leg: Secondary | ICD-10-CM

## 2015-10-25 DIAGNOSIS — E669 Obesity, unspecified: Secondary | ICD-10-CM | POA: Diagnosis not present

## 2015-10-25 DIAGNOSIS — I10 Essential (primary) hypertension: Secondary | ICD-10-CM | POA: Diagnosis not present

## 2015-10-25 DIAGNOSIS — R600 Localized edema: Secondary | ICD-10-CM

## 2015-10-25 DIAGNOSIS — Z23 Encounter for immunization: Secondary | ICD-10-CM

## 2015-10-25 DIAGNOSIS — M79609 Pain in unspecified limb: Secondary | ICD-10-CM | POA: Insufficient documentation

## 2015-10-25 HISTORY — DX: Encounter for immunization: Z23

## 2015-10-25 HISTORY — DX: Pain in unspecified limb: M79.609

## 2015-10-25 HISTORY — DX: Localized edema: R60.0

## 2015-10-25 LAB — BASIC METABOLIC PANEL
BUN: 21 mg/dL (ref 7–25)
CHLORIDE: 102 mmol/L (ref 98–110)
CO2: 26 mmol/L (ref 20–31)
Calcium: 9.7 mg/dL (ref 8.6–10.4)
Creat: 0.95 mg/dL (ref 0.50–0.99)
Glucose, Bld: 101 mg/dL — ABNORMAL HIGH (ref 65–99)
POTASSIUM: 4 mmol/L (ref 3.5–5.3)
SODIUM: 139 mmol/L (ref 135–146)

## 2015-10-25 MED ORDER — POTASSIUM CHLORIDE ER 10 MEQ PO TBCR: 10.0000 meq | EXTENDED_RELEASE_TABLET | Freq: Every day | ORAL | 0 refills | Status: DC

## 2015-10-25 MED ORDER — VALSARTAN-HYDROCHLOROTHIAZIDE 320-25 MG PO TABS: 1.0000 | ORAL_TABLET | Freq: Every day | ORAL | 0 refills | Status: DC

## 2015-10-25 MED ORDER — FUROSEMIDE 20 MG PO TABS: 20.0000 mg | ORAL_TABLET | Freq: Every day | ORAL | 0 refills | Status: DC

## 2015-10-25 NOTE — Progress Notes (Signed)
Subjective: Chief Complaint  Patient presents with  . leg pain    leg pain in both calves for the last month and half   She notes pains in lower legs, calves, gets swelling in calves bilaterally x 2 months.  Pain is worsening.   Just saw pulmonology recently, was advised that the sarcoidosis was not active.  She has hx/o of this.   Her lung doctor ordered ultrasounds of legs, and there were no blood clots.   With hills or walking fast gets some SOB, but not with other exercise or walking.  Nothing alarming to her.   No chest pain , no palpitations.   Legs hurt worse when getting out of the bed or standing.  Seems to be related to the swelling.  Doesn't drink soda but occasionally, not eating a lot of fast food.  But she does note not being as active and careful with diet as prior months ago ,has gained 6lb since May.  Last was exercising regularly before June.   No prior similar leg pain.  No other aggravating or relieving factors. No other complaint.      Past Medical History:  Diagnosis Date  . Hypertension   . Obesity   . Sarcoidosis of lung (HCC)    uses Adavir and Albuterol for reactive airway but sarcoid reportedly not active; sees pulmology   Current Outpatient Prescriptions on File Prior to Visit  Medication Sig Dispense Refill  . Albuterol Sulfate (PROAIR RESPICLICK) 108 (90 Base) MCG/ACT AEPB Inhale 2 puffs into the lungs 4 (four) times daily as needed. 1 each 5  . amLODipine (NORVASC) 5 MG tablet Take 1 tablet (5 mg total) by mouth daily. 90 tablet 0  . Biotin 1000 MCG tablet Take 1,000 mcg by mouth daily.    . Fluticasone-Salmeterol (ADVAIR) 500-50 MCG/DOSE AEPB Inhale 1 puff into the lungs every 12 (twelve) hours. 60 each 5  . hydrocortisone 2.5 % cream APPLY TO AFFECTED AREA TWICE A DAY AS NEEDED  2   No current facility-administered medications on file prior to visit.    ROS as in subjective   Objective: BP 124/82   Pulse 100   Temp 98.7 F (37.1 C) (Oral)   Wt 206 lb  3.2 oz (93.5 kg)   BMI 38.96 kg/m   Wt Readings from Last 3 Encounters:  10/25/15 206 lb 3.2 oz (93.5 kg)  10/07/15 205 lb 3.2 oz (93.1 kg)  06/10/15 200 lb (90.7 kg)   BP Readings from Last 3 Encounters:  10/25/15 124/82  10/07/15 134/72  06/10/15 132/74   General appearance: alert, no distress, WD/WN, AA female Oral cavity: MMM Neck: supple, no lymphadenopathy, no thyromegaly, no masses, no bruits, but pulsation of right neck noted with heart beat, slight JVD Heart: RRR, normal S1, S2, no murmurs Lungs: CTA bilaterally, no wheezes, rhonchi, or rales Pulses: 2+ symmetric, upper and lower extremities, normal cap refill Mild 1+ nonpitting edema of bilat calves/lower legs, but not so much in the ankles Legs with mild general tenderness of calves, otherwise nontender, no deformity, leg ROM normal, no pain with leg ROM in hips knees and ankles Legs neurovascularly intact    Assessment: Encounter Diagnoses  Name Primary?  . Pain of lower leg, unspecified laterality Yes  . Bilateral leg edema   . Obesity   . Essential hypertension   . Need for prophylactic vaccination and inoculation against influenza     Plan: Pain seems to be related to recent swelling in legs.  Discussed differential for lower leg edema, likely combination of obesity, less exercise, dependent edema/venous insufficiency, more salt intake of late.  Advised OTC Compression hose, leg elevation, get back to walking regularly.   C/t current BP medication Diovan HCT but add Lasix and K-dur daily for the next 1-2 weeks.  Discussed proper use of the medication, risks/benefits with the medication.  BMET lab today.   Reviewed labs from 2016, reviewed recent pulmonology notes.  No suspicion of CHF today, but advised she check weights daily until recheck.   reviewed EKG from 2016 in chart.  F/u in 1-2 weeks with Dr.Lalonde as she is due for physical/med check.     Counseled on the influenza virus vaccine.  Vaccine information  sheet given.  Influenza vaccine given after consent obtained.  Margaretha GlassingLoretta was seen today for leg pain.  Diagnoses and all orders for this visit:  Pain of lower leg, unspecified laterality  Bilateral leg edema -     Basic metabolic panel -     furosemide (LASIX) 20 MG tablet; Take 1 tablet (20 mg total) by mouth daily. -     potassium chloride (K-DUR) 10 MEQ tablet; Take 1 tablet (10 mEq total) by mouth daily. -     valsartan-hydrochlorothiazide (DIOVAN-HCT) 320-25 MG tablet; Take 1 tablet by mouth daily.  Obesity  Essential hypertension -     Basic metabolic panel  Need for prophylactic vaccination and inoculation against influenza

## 2015-10-25 NOTE — Addendum Note (Signed)
Addended by: Herminio CommonsJOHNSON, Analeia Ismael A on: 10/25/2015 10:44 AM   Modules accepted: Orders

## 2015-11-11 ENCOUNTER — Ambulatory Visit: Payer: 59 | Admitting: Pulmonary Disease

## 2015-11-22 ENCOUNTER — Ambulatory Visit (INDEPENDENT_AMBULATORY_CARE_PROVIDER_SITE_OTHER): Payer: Medicare Other | Admitting: Family Medicine

## 2015-11-22 ENCOUNTER — Encounter: Payer: Self-pay | Admitting: Family Medicine

## 2015-11-22 VITALS — BP 114/76 | HR 104 | Ht 61.0 in | Wt 206.0 lb

## 2015-11-22 DIAGNOSIS — Z13820 Encounter for screening for osteoporosis: Secondary | ICD-10-CM | POA: Diagnosis not present

## 2015-11-22 DIAGNOSIS — I10 Essential (primary) hypertension: Secondary | ICD-10-CM | POA: Diagnosis not present

## 2015-11-22 DIAGNOSIS — E6609 Other obesity due to excess calories: Secondary | ICD-10-CM | POA: Diagnosis not present

## 2015-11-22 DIAGNOSIS — E785 Hyperlipidemia, unspecified: Secondary | ICD-10-CM | POA: Diagnosis not present

## 2015-11-22 DIAGNOSIS — D869 Sarcoidosis, unspecified: Secondary | ICD-10-CM

## 2015-11-22 DIAGNOSIS — Z6835 Body mass index (BMI) 35.0-35.9, adult: Secondary | ICD-10-CM | POA: Diagnosis not present

## 2015-11-22 HISTORY — DX: Hyperlipidemia, unspecified: E78.5

## 2015-11-22 LAB — CBC WITH DIFFERENTIAL/PLATELET
BASOS ABS: 0 {cells}/uL (ref 0–200)
Basophils Relative: 0 %
EOS ABS: 308 {cells}/uL (ref 15–500)
EOS PCT: 4 %
HEMATOCRIT: 39.7 % (ref 35.0–45.0)
HEMOGLOBIN: 13.3 g/dL (ref 11.7–15.5)
LYMPHS ABS: 1925 {cells}/uL (ref 850–3900)
Lymphocytes Relative: 25 %
MCH: 29.3 pg (ref 27.0–33.0)
MCHC: 33.5 g/dL (ref 32.0–36.0)
MCV: 87.4 fL (ref 80.0–100.0)
MPV: 11.3 fL (ref 7.5–12.5)
Monocytes Absolute: 616 cells/uL (ref 200–950)
Monocytes Relative: 8 %
NEUTROS PCT: 63 %
Neutro Abs: 4851 cells/uL (ref 1500–7800)
PLATELETS: 225 10*3/uL (ref 140–400)
RBC: 4.54 MIL/uL (ref 3.80–5.10)
RDW: 14.6 % (ref 11.0–15.0)
WBC: 7.7 10*3/uL (ref 4.0–10.5)

## 2015-11-22 LAB — LIPID PANEL
CHOL/HDL RATIO: 2.7 ratio (ref <=5.0)
Cholesterol: 190 mg/dL (ref 125–200)
HDL: 70 mg/dL (ref >=46)
LDL Cholesterol: 101 mg/dL (ref <130)
TRIGLYCERIDES: 97 mg/dL (ref <150)
VLDL: 19 mg/dL (ref <30)

## 2015-11-22 MED ORDER — VALSARTAN-HYDROCHLOROTHIAZIDE 320-25 MG PO TABS: 1.0000 | ORAL_TABLET | Freq: Every day | ORAL | 3 refills | Status: AC

## 2015-11-22 MED ORDER — AMLODIPINE BESYLATE 5 MG PO TABS: 5.0000 mg | ORAL_TABLET | Freq: Every day | ORAL | 3 refills | Status: AC

## 2015-11-22 NOTE — Progress Notes (Signed)
   Subjective:    Patient ID: Terri Fitzgerald Code, female    DOB: 04/11/1950, 65 y.o.   MRN: [id number]  HPI She is here for medication management visit. She was seen recently for evaluation of sarcoidosis and apparently this is now quiescent. The leg edema is giving her some difficulty but she continues to exercise. She states he is walking half an hour several times per week. She has seen gynecologist and did have a mammogram as well as Pap. She has not had a DEXA scan. Continues on her blood pressure medications without problem. Review of the record indicates slightly elevated lipid panel. She is retired and enjoying her retirement. She is helping to take care of some of her grandchildren. Family and social history as well as health maintenance and immunizations were reviewed.   Review of Systems     Objective:   Physical Exam BP 114/76   Pulse (!) 104   Ht 5\' 1"  (1.549 m)   Wt 206 lb (93.4 kg)   SpO2 94%   BMI 38.92 kg/m   General Appearance:    Alert, cooperative, no distress, appears stated age  Head:    Normocephalic, without obvious abnormality, atraumatic  Eyes:    PERRL, conjunctiva/corneas clear, EOM's intact, fundi    benign  Ears:    Normal TM's and external ear canals  Nose:   Nares normal, mucosa normal, no drainage or sinus   tenderness  Throat:   Lips, mucosa, and tongue normal; teeth and gums normal  Neck:   Supple, no lymphadenopathy;  thyroid:  no   enlargement/tenderness/nodules; no carotid   bruit or JVD     Lungs:     Clear to auscultation bilaterally without wheezes, rales or     ronchi; respirations unlabored      Heart:    Regular rate and rhythm, S1 and S2 normal, no murmur, rub   or gallop  Breast Exam:    Deferred to GYN  Abdomen:     Soft, non-tender, nondistended, normoactive bowel sounds,    no masses, no hepatosplenomegaly  Genitalia:    Deferred to GYN     Extremities:   No clubbing, cyanosis Minimal edema is noted.   Pulses:   2+ and symmetric  all extremities  Skin:   Skin color, texture, turgor normal, no rashes or lesions  Lymph nodes:   Cervical, supraclavicular, and axillary nodes normal  Neurologic:   CNII-XII intact, normal strength, sensation and gait; reflexes 2+ and symmetric throughout          Psych:   Normal mood, affect, hygiene and grooming.          Assessment & Plan:  Screening for osteoporosis - Plan: DG Bone Density  Sarcoidosis (HCC)  Class 2 obesity due to excess calories without serious comorbidity with body mass index (BMI) of 35.0 to 35.9 in adult - Plan: CBC with Differential/Platelet, Lipid panel  Hyperlipidemia, unspecified hyperlipidemia type - Plan: Lipid panel  Essential hypertension I encouraged her to continue with her active lifestyle. She will continue on her present medications. Apparently she is using an inhaler however I'll he for pulmonary concerning that.

## 2015-12-03 DIAGNOSIS — H524 Presbyopia: Secondary | ICD-10-CM | POA: Diagnosis not present

## 2015-12-03 DIAGNOSIS — H5340 Unspecified visual field defects: Secondary | ICD-10-CM | POA: Diagnosis not present

## 2015-12-03 DIAGNOSIS — D860 Sarcoidosis of lung: Secondary | ICD-10-CM | POA: Diagnosis not present

## 2015-12-16 ENCOUNTER — Ambulatory Visit (INDEPENDENT_AMBULATORY_CARE_PROVIDER_SITE_OTHER): Payer: Medicare Other | Admitting: Pulmonary Disease

## 2015-12-16 VITALS — BP 110/60 | HR 99 | Ht 62.0 in | Wt 201.0 lb

## 2015-12-16 DIAGNOSIS — D869 Sarcoidosis, unspecified: Secondary | ICD-10-CM

## 2015-12-16 DIAGNOSIS — R0600 Dyspnea, unspecified: Secondary | ICD-10-CM

## 2015-12-16 NOTE — Progress Notes (Signed)
Terri PalmsLoretta Brown Fitzgerald    [id number]    05/21/1950  Primary Care Physician:LALONDE,JOHN Terri MostHARLES, MD  Referring Physician: Ronnald NianJohn C Lalonde, MD [address] [place], [place] [id number]  Chief complaint:  Follow up for management of sarcoidosis  HPI: Terri Fitzgerald is a 65 year old with diagnosis of pulmonary sarcoidosis. This was diagnosed [place] around 1990. She has been followed by a pulmonologist there and has been on and off prednisone. She is stopped taking the prednisone about 5 years ago due to an improvement in her symptoms. She states that she feels well currently. Denies any shortness of breath, chest pain, cough, sputum production, palpitations. She's asked her primary care to refer her to a pulmonary specialist for routine follow-up. She does not recall the name of the [facility]ologist [facility].  She has been prescribed Advair and albuterol. But she does not use this as she is asymptomatic. She does not have any extrapulmonary manifestations of sarcoid. She is scheduled to have ophthalmic checkup next week. She's had a routine chest x-ray and labs including BMP, LFTs, CBC last month. The labs were within normal limits however the chest x-ray shows bilateral opacities. I do not have an older x-ray to compare.  Interim history: She had an evaluation with lower extremity Dopplers, d-dimer after she complained of lower extremity swelling following the car trip. There is no evidence of DVT and d-dimer is negative. She has some complaints of clear sputum, cough. There is no increased dyspnea. There are no fevers or chills. She denies any chest pain, palpitations.  Outpatient Encounter Prescriptions as of 12/16/2015  Medication Sig  . Albuterol Sulfate (PROAIR RESPICLICK) 108 (90 Base) MCG/ACT AEPB Inhale 2 puffs into the lungs 4 (four) times daily as needed.  Marland Kitchen. amLODipine (NORVASC) 5 MG tablet Take 1 tablet (5 mg total) by mouth daily.  .  Biotin 1000 MCG tablet Take 1,000 mcg by mouth as needed.   . Fluticasone-Salmeterol (ADVAIR) 500-50 MCG/DOSE AEPB Inhale 1 puff into the lungs every 12 (twelve) hours.  . hydrocortisone 2.5 % cream APPLY TO AFFECTED AREA TWICE A DAY AS NEEDED  . valsartan-hydrochlorothiazide (DIOVAN-HCT) 320-25 MG tablet Take 1 tablet by mouth daily.  . furosemide (LASIX) 20 MG tablet Take 1 tablet (20 mg total) by mouth daily. (Patient not taking: Reported on 12/16/2015)  . potassium chloride (K-DUR) 10 MEQ tablet Take 1 tablet (10 mEq total) by mouth daily. (Patient not taking: Reported on 12/16/2015)   No facility-administered encounter medications on file as of 12/16/2015.     Allergies as of 12/16/2015  . (No Known Allergies)    Past Medical History:  Diagnosis Date  . Hypertension   . Obesity   . Sarcoidosis of lung (HCC)    uses Adavir and Albuterol for reactive airway but sarcoid reportedly not active; sees pulmology    Past Surgical History:  Procedure Laterality Date  . TUBAL LIGATION      Family History  Problem Relation Age of Onset  . Asthma Maternal Grandmother   . Prostate cancer Brother     Social History   Social History  . Marital status: Widowed    Spouse name: N/A  . Number of children: N/A  . Years of education: N/A   Occupational History  . Not on file.   Social History Main Topics  . Smoking status: Never Smoker  . Smokeless tobacco: Never Used  . Alcohol use Yes  Comment: Rare  . Drug use: No  . Sexual activity: Not Currently   Other Topics Concern  . Not on file   Social History Narrative  . No narrative on file    Review of systems: Review of Systems  Constitutional: Negative for fever and chills.  HENT: Negative.   Eyes: Negative for blurred vision.  Respiratory: as per HPI  Cardiovascular: Negative for chest pain and palpitations.  Gastrointestinal: Negative for vomiting, diarrhea, blood per rectum. Genitourinary: Negative for dysuria,  urgency, frequency and hematuria.  Musculoskeletal: Negative for myalgias, back pain and joint pain. LE edema  Skin: Negative for itching and rash.  Neurological: Negative for dizziness, tremors, focal weakness, seizures and loss of consciousness.  Endo/Heme/Allergies: Negative for environmental allergies.  Psychiatric/Behavioral: Negative for depression, suicidal ideas and hallucinations.  All other systems reviewed and are negative.   Physical Exam: Blood pressure 134/72, pulse 92, height 5\' 1"  (1.549 m), weight 205 lb 3.2 oz (93.1 kg), SpO2 97 %. Gen:      No acute distress HEENT:  EOMI, sclera anicteric Neck:     No masses; no thyromegaly Lungs:    Clear to auscultation bilaterally; normal respiratory effort CV:         Regular rate and rhythm; no murmurs Abd:      + bowel sounds; soft, non-tender; no palpable masses, no distension Ext:   1+ peripheral edema; adequate peripheral perfusion Skin:      Warm and dry; no rash Neuro: alert and oriented x 3 Psych: normal mood and affect  Data Reviewed: ACE levels 11/19/14 32 (normal)  PFTs 06/10/15 FVC 2.08 (90%) FEV1 1.90 [106%] F/F 91 TLC 73% DLCO 60%.  CXR 06/10/15 Chronic changes without acute abnormality.   EKG 11/19/14- NSR, no acute changes D dimer 10/07/15- 0.24 LE dopplers 10/07/15- No DVT.  Assessment:  Pulmonary sarcoidosis. Diagnosis by transbronchial biopsy in 1990. She had previously been on prednisone but has been off it for the past 5 years. Her chest x-ray shows chronic stable fibrosis. But she is completely asymptomatic and is feeling well. Labs including BMP, LFTs, CBC were within normal limits and ACE level is also normal. EKG is unremarkable. PFTs shows restriction and DLCO reduction c/w with her sarcoidosis.  Her sarcoidosis is not active at present and I don't believe she'll require any treatment with prednisone or other therapies. However will continue to follow along with regular monitoring. She had a  recent normal opthal examination.                                                                                      Plan/Recommendations: - Follow up in 6 months.   Chilton GreathousePraveen Rosabell Geyer MD [facility] Pager [phone] 12/16/2015, 10:34 AM  CC: Ronnald NianLalonde, John C, MD

## 2015-12-16 NOTE — Patient Instructions (Signed)
Will follow in the office in 6 months. Please call if there is any worsening of his symptoms of cough, sputum production, dyspnea

## 2015-12-26 ENCOUNTER — Encounter: Payer: Self-pay | Admitting: Pulmonary Disease

## 2015-12-30 DIAGNOSIS — M79606 Pain in leg, unspecified: Secondary | ICD-10-CM | POA: Diagnosis not present

## 2015-12-30 DIAGNOSIS — D860 Sarcoidosis of lung: Secondary | ICD-10-CM | POA: Diagnosis not present

## 2015-12-30 DIAGNOSIS — I10 Essential (primary) hypertension: Secondary | ICD-10-CM | POA: Diagnosis not present

## 2015-12-30 DIAGNOSIS — Z6836 Body mass index (BMI) 36.0-36.9, adult: Secondary | ICD-10-CM | POA: Diagnosis not present

## 2015-12-30 DIAGNOSIS — E663 Overweight: Secondary | ICD-10-CM | POA: Diagnosis not present

## 2016-02-24 ENCOUNTER — Telehealth: Payer: Self-pay

## 2016-02-24 NOTE — Telephone Encounter (Signed)
Records sent to [facility] per pt request. Pt has [facility]. Terri Fitzgerald/RLB

## 2016-02-25 ENCOUNTER — Other Ambulatory Visit: Payer: Self-pay | Admitting: Pulmonary Disease

## 2016-02-25 DIAGNOSIS — I10 Essential (primary) hypertension: Secondary | ICD-10-CM

## 2016-05-11 DIAGNOSIS — H00014 Hordeolum externum left upper eyelid: Secondary | ICD-10-CM | POA: Diagnosis not present

## 2016-06-29 DIAGNOSIS — E663 Overweight: Secondary | ICD-10-CM | POA: Diagnosis not present

## 2016-06-29 DIAGNOSIS — I10 Essential (primary) hypertension: Secondary | ICD-10-CM | POA: Diagnosis not present

## 2016-06-29 DIAGNOSIS — Z6836 Body mass index (BMI) 36.0-36.9, adult: Secondary | ICD-10-CM | POA: Diagnosis not present

## 2016-06-29 DIAGNOSIS — R05 Cough: Secondary | ICD-10-CM | POA: Diagnosis not present

## 2016-10-20 DIAGNOSIS — Z23 Encounter for immunization: Secondary | ICD-10-CM | POA: Diagnosis not present

## 2016-11-10 DIAGNOSIS — H35033 Hypertensive retinopathy, bilateral: Secondary | ICD-10-CM | POA: Diagnosis not present

## 2016-11-10 DIAGNOSIS — H524 Presbyopia: Secondary | ICD-10-CM | POA: Diagnosis not present

## 2016-11-16 DIAGNOSIS — H35033 Hypertensive retinopathy, bilateral: Secondary | ICD-10-CM | POA: Diagnosis not present

## 2016-12-01 DIAGNOSIS — Z01419 Encounter for gynecological examination (general) (routine) without abnormal findings: Secondary | ICD-10-CM | POA: Diagnosis not present

## 2016-12-01 DIAGNOSIS — Z1231 Encounter for screening mammogram for malignant neoplasm of breast: Secondary | ICD-10-CM | POA: Diagnosis not present

## 2016-12-01 DIAGNOSIS — Z124 Encounter for screening for malignant neoplasm of cervix: Secondary | ICD-10-CM | POA: Diagnosis not present

## 2016-12-21 DIAGNOSIS — M8589 Other specified disorders of bone density and structure, multiple sites: Secondary | ICD-10-CM | POA: Diagnosis not present

## 2017-01-06 DIAGNOSIS — Z7189 Other specified counseling: Secondary | ICD-10-CM | POA: Diagnosis not present

## 2017-01-06 DIAGNOSIS — E663 Overweight: Secondary | ICD-10-CM | POA: Diagnosis not present

## 2017-01-06 DIAGNOSIS — I10 Essential (primary) hypertension: Secondary | ICD-10-CM | POA: Diagnosis not present

## 2017-01-06 DIAGNOSIS — Z1389 Encounter for screening for other disorder: Secondary | ICD-10-CM | POA: Diagnosis not present

## 2017-01-06 DIAGNOSIS — D860 Sarcoidosis of lung: Secondary | ICD-10-CM | POA: Diagnosis not present

## 2017-01-06 DIAGNOSIS — Z0000 Encounter for general adult medical examination without abnormal findings: Secondary | ICD-10-CM | POA: Diagnosis not present

## 2017-01-11 ENCOUNTER — Ambulatory Visit (INDEPENDENT_AMBULATORY_CARE_PROVIDER_SITE_OTHER): Payer: Medicare Other | Admitting: Pulmonary Disease

## 2017-01-11 ENCOUNTER — Other Ambulatory Visit: Payer: Self-pay | Admitting: Family Medicine

## 2017-01-11 ENCOUNTER — Encounter: Payer: Self-pay | Admitting: Pulmonary Disease

## 2017-01-11 DIAGNOSIS — I10 Essential (primary) hypertension: Secondary | ICD-10-CM

## 2017-01-11 DIAGNOSIS — D869 Sarcoidosis, unspecified: Secondary | ICD-10-CM

## 2017-01-11 MED ORDER — ALBUTEROL SULFATE 108 (90 BASE) MCG/ACT IN AEPB: 2.0000 | INHALATION_SPRAY | Freq: Four times a day (QID) | RESPIRATORY_TRACT | 5 refills | Status: DC | PRN

## 2017-01-11 NOTE — Patient Instructions (Signed)
I am glad you are doing well with regard to your breathing.  Will stop the Advair Continue the albuterol inhaler as needed I will follow-up with you in 1 year.  Please call us for a sooner appointment if there is any change in symptoms.

## 2017-01-11 NOTE — Telephone Encounter (Signed)
Pt has transferred to [facility]. Will deny scripts.

## 2017-01-11 NOTE — Progress Notes (Signed)
Terri PalmsLoretta Brown Fitzgerald    [id number]    07/17/1950  Primary Care Physician:Lalonde, Everardo AllJohn C, MD  Referring Physician: Ronnald NianLalonde, John C, MD [address] [place] [id number]  Chief complaint:  Follow up for sarcoidosis  HPI: Terri Fitzgerald is a 66 year old with diagnosis of pulmonary sarcoidosis. This [place]. She has been followed by a pulmonologist there and has been on and off prednisone. She is stopped taking the prednisone in 2011 due to an improvement in her symptoms. She's asked her primary care to refer her to a pulmonary specialist for routine follow-up. She does [facility].  She has been prescribed Advair and albuterol. But she does not use this as she is asymptomatic. She does not have any extrapulmonary manifestations of sarcoid. She's had a routine chest x-ray and labs including BMP, LFTs, CBC last month. The labs were within normal limits however the chest x-ray shows bilateral opacities. I do not have an older x-ray to compare.  Interim history: She is doing well since her last visit.  She last used Advair in July 2018 and has not required to use her albuterol rescue inhaler.  Outpatient Encounter Medications as of 01/11/2017  Medication Sig  . amLODipine (NORVASC) 5 MG tablet Take 1 tablet (5 mg total) by mouth daily.  . Biotin 1000 MCG tablet Take 1,000 mcg by mouth as needed.   . Calcium Carbonate-Vitamin D (CALCIUM PLUS VITAMIN D) 300-100 MG-UNIT CAPS Take 2 tablets by mouth daily.  . furosemide (LASIX) 20 MG tablet Take 1 tablet (20 mg total) by mouth daily.  . Glucos-Chondroit-Collag-Hyal (JOINT SUPPORT FORMULA PO) Take 2 tablets by mouth daily.  . hydrocortisone 2.5 % cream APPLY TO AFFECTED AREA TWICE A DAY AS NEEDED  . Omega-3 1000 MG CAPS Take 2 capsules by mouth daily.  . potassium chloride (K-DUR) 10 MEQ tablet Take 1 tablet (10 mEq total) by  mouth daily.  . valsartan-hydrochlorothiazide (DIOVAN-HCT) 320-25 MG tablet Take 1 tablet by mouth daily.  Marland Kitchen. ADVAIR DISKUS 500-50 MCG/DOSE AEPB INHALE 1 PUFF INTO THE LUNGS EVERY 12 (TWELVE) HOURS. (Patient not taking: Reported on 01/11/2017)  . Albuterol Sulfate (PROAIR RESPICLICK) 108 (90 Base) MCG/ACT AEPB Inhale 2 puffs into the lungs 4 (four) times daily as needed. (Patient not taking: Reported on 01/11/2017)   No facility-administered encounter medications on file as of 01/11/2017.     Allergies as of 01/11/2017  . (No Known Allergies)    Past Medical History:  Diagnosis Date  . Hypertension   . Obesity   . Sarcoidosis of lung (HCC)    uses Adavir and Albuterol for reactive airway but sarcoid reportedly not active; sees pulmology    Past Surgical History:  Procedure Laterality Date  . TUBAL LIGATION      Family History  Problem Relation Age of Onset  . Asthma Maternal Grandmother   . Prostate cancer Brother     Social History   Socioeconomic History  . Marital status: Widowed    Spouse name: Not on file  . Number of children: Not on file  . Years of education: Not on file  . Highest education level: Not on file  Social Needs  . Financial resource strain: Not on file  . Food insecurity - worry: Not on file  . Food insecurity - inability: Not on file  . Transportation needs -  medical: Not on file  . Transportation needs - non-medical: Not on file  Occupational History  . Not on file  Tobacco Use  . Smoking status: Never Smoker  . Smokeless tobacco: Never Used  Substance and Sexual Activity  . Alcohol use: Yes    Comment: Rare  . Drug use: No  . Sexual activity: Not Currently  Other Topics Concern  . Not on file  Social History Narrative  . Not on file    Review of systems: Review of Systems  Constitutional: Negative for fever and chills.  HENT: Negative.   Eyes: Negative for blurred vision.  Respiratory: as per HPI  Cardiovascular: Negative for  chest pain and palpitations.  Gastrointestinal: Negative for vomiting, diarrhea, blood per rectum. Genitourinary: Negative for dysuria, urgency, frequency and hematuria.  Musculoskeletal: Negative for myalgias, back pain and joint pain. LE edema  Skin: Negative for itching and rash.  Neurological: Negative for dizziness, tremors, focal weakness, seizures and loss of consciousness.  Endo/Heme/Allergies: Negative for environmental allergies.  Psychiatric/Behavioral: Negative for depression, suicidal ideas and hallucinations.  All other systems reviewed and are negative.  Physical Exam: Blood pressure 134/72, pulse 92, height 5\' 1"  (1.549 m), weight 205 lb 3.2 oz (93.1 kg), SpO2 97 %. Gen:      No acute distress HEENT:  EOMI, sclera anicteric Neck:     No masses; no thyromegaly Lungs:    Clear to auscultation bilaterally; normal respiratory effort CV:         Regular rate and rhythm; no murmurs Abd:      + bowel sounds; soft, non-tender; no palpable masses, no distension Ext:   1+ peripheral edema; adequate peripheral perfusion Skin:      Warm and dry; no rash Neuro: alert and oriented x 3 Psych: normal mood and affect  Data Reviewed: ACE levels 11/19/14 32 (normal)  PFTs 06/10/15 FVC 2.08 (90%), FEV1 1.90 [106%], F/F 91, TLC 73%, DLCO 60%. Minimal restriction, moderate diffusion impairment the corrects for alveolar volume.  CXR 06/10/15 Chronic changes without acute abnormality.   EKG 11/19/14- NSR, no acute changes D dimer 10/07/15- 0.24 LE dopplers 10/07/15- No DVT.  Assessment:  Pulmonary sarcoidosis. Diagnosis by transbronchial biopsy in 1990. She had previously been on prednisone but has been off it for the past 5 years. Her chest x-ray shows chronic stable fibrosis. But she is completely asymptomatic and is feeling well. Labs including BMP, LFTs, CBC were within normal limits and ACE level is also normal. EKG is unremarkable. PFTs shows restriction and DLCO reduction c/w with  her sarcoidosis.  Her sarcoidosis is not active at present and I don't believe she'll require any treatment with prednisone or other therapies.  She has not used Advair for the last 6 months and will take it off her medication list.  Continue to use the albuterol inhaler as needed She had a recent normal opthal examination.                                                                                      Plan/Recommendations: -Stop Advair -Continue albuterol rescue inhaler  Follow-up in 1 year  Chilton GreathousePraveen Lezlie Ritchey MD [facility] Pager [phone] 01/11/2017, 4:25 PM  CC: Ronnald Nian, MD

## 2017-02-22 ENCOUNTER — Other Ambulatory Visit: Payer: Self-pay | Admitting: Family Medicine

## 2017-02-22 DIAGNOSIS — I10 Essential (primary) hypertension: Secondary | ICD-10-CM

## 2017-06-28 DIAGNOSIS — Z23 Encounter for immunization: Secondary | ICD-10-CM | POA: Diagnosis not present

## 2017-08-03 DIAGNOSIS — R05 Cough: Secondary | ICD-10-CM | POA: Diagnosis not present

## 2017-08-03 DIAGNOSIS — J45901 Unspecified asthma with (acute) exacerbation: Secondary | ICD-10-CM | POA: Diagnosis not present

## 2017-08-03 DIAGNOSIS — H66003 Acute suppurative otitis media without spontaneous rupture of ear drum, bilateral: Secondary | ICD-10-CM | POA: Diagnosis not present

## 2017-10-04 ENCOUNTER — Telehealth: Payer: Self-pay | Admitting: Acute Care

## 2017-10-04 ENCOUNTER — Encounter: Payer: Self-pay | Admitting: Acute Care

## 2017-10-04 ENCOUNTER — Other Ambulatory Visit: Payer: Medicare Other

## 2017-10-04 ENCOUNTER — Ambulatory Visit (INDEPENDENT_AMBULATORY_CARE_PROVIDER_SITE_OTHER): Payer: Medicare Other | Admitting: Acute Care

## 2017-10-04 ENCOUNTER — Ambulatory Visit (INDEPENDENT_AMBULATORY_CARE_PROVIDER_SITE_OTHER)
Admission: RE | Admit: 2017-10-04 | Discharge: 2017-10-04 | Disposition: A | Discharge status: Home / Self Care | Payer: Medicare Other | Source: Ambulatory Visit | Attending: Acute Care | Admitting: Acute Care

## 2017-10-04 VITALS — BP 120/82 | HR 98 | Ht 62.0 in | Wt 200.8 lb

## 2017-10-04 DIAGNOSIS — R05 Cough: Secondary | ICD-10-CM | POA: Diagnosis not present

## 2017-10-04 DIAGNOSIS — D869 Sarcoidosis, unspecified: Secondary | ICD-10-CM

## 2017-10-04 DIAGNOSIS — J302 Other seasonal allergic rhinitis: Secondary | ICD-10-CM

## 2017-10-04 DIAGNOSIS — J849 Interstitial pulmonary disease, unspecified: Secondary | ICD-10-CM

## 2017-10-04 HISTORY — DX: Other seasonal allergic rhinitis: J30.2

## 2017-10-04 MED ORDER — PREDNISONE 10 MG PO TABS: ORAL_TABLET | ORAL | 0 refills | Status: DC

## 2017-10-04 MED ORDER — FLUTICASONE-SALMETEROL 500-50 MCG/DOSE IN AEPB: 1.0000 | INHALATION_SPRAY | Freq: Two times a day (BID) | RESPIRATORY_TRACT | 6 refills | Status: DC

## 2017-10-04 MED ORDER — HYDROCODONE-HOMATROPINE 5-1.5 MG/5ML PO SYRP: 5.0000 mL | ORAL_SOLUTION | Freq: Four times a day (QID) | ORAL | 0 refills | Status: DC | PRN

## 2017-10-04 MED ORDER — LEVALBUTEROL HCL 0.63 MG/3ML IN NEBU
0.6300 mg | INHALATION_SOLUTION | Freq: Once | RESPIRATORY_TRACT | Status: AC
  Administered 2017-10-04: 0.63 mg via RESPIRATORY_TRACT

## 2017-10-04 NOTE — Progress Notes (Addendum)
History of Present Illness Terri Fitzgerald is a 67 y.o. female with pulmonary sarcoidosis. She is followed by Dr. Isaiah Serge.  Terri Fitzgerald is a 67 year old with diagnosis of pulmonary sarcoidosis. This was diagnosed by transbronchial biopsy in [place] around 1990. She has been followed by a pulmonologist there and has been on and off prednisone. She is stopped taking the prednisone in 2011 due to an improvement in her symptoms. She's asked her primary care to refer her to a pulmonary specialist for routine follow-up. She does not recall the name of the pulmonologist at [facility].  She has been prescribed Advair and albuterol.She only uses these when she is symptomatic. She does not have any extrapulmonary manifestations of sarcoid. She last  had routine chest x-ray and labs including BMP, LFTs, CBC in 12/2016.  Marland KitchenPFTs show restriction and DLCO reduction c/w with her sarcoidosis..   10/04/2017  Pt. Presents for an acute visit.She states she has been coughing and wheezing for 1 month. She thought it was related to allergies. She did see her PCP about it. He confirmed that she thought it was allergies. She took Zyrtec and Mucinex along with proair and Advair. It got better for 2 weeks and now it has become worse again. He has clear secretions , she is wheezing and he has been using albuterol inhaler 3 times daily for the last month.She states she does have post nasal gtt at times.She had stopped taking the Zyrtec when her eyes stopped itching. She denies fever, chest pain, orthopnea or hemoptysis.      Test Results Reviewed: CXR 10/04/2017 Chronic interstitial lung disease without evidence of acute Abnormality. ACE 10/04/2017>> 23 ( Normal)  ACE levels 11/19/14 32 (normal)  PFTs 06/10/15 FVC 2.08 (90%), FEV1 1.90 [106%], F/F 91, TLC 73%, DLCO 60%. Minimal restriction, moderate diffusion impairment the corrects for alveolar volume.  CXR 06/10/15 Chronic changes without acute  abnormality.   EKG 11/19/14- NSR, no acute changes D dimer 10/07/15- 0.24 LE dopplers 10/07/15- No DVT.  CBC Latest Ref Rng & Units 11/22/2015 10/18/2014 08/04/2013  WBC 4.0 - 10.5 K/uL 7.7 7.2 8.6  Hemoglobin 11.7 - 15.5 g/dL 16.1 09.6 04.5  Hematocrit 35.0 - 45.0 % 39.7 42.6 39.4  Platelets 140 - 400 K/uL 225 211 182    BMP Latest Ref Rng & Units 10/25/2015 10/18/2014 08/04/2013  Glucose 65 - 99 mg/dL 409(W) 119(J) 478(G)  BUN 7 - 25 mg/dL 21 17 16   Creatinine 0.50 - 0.99 mg/dL 9.56 2.13 0.86  Sodium 135 - 146 mmol/L 139 139 141  Potassium 3.5 - 5.3 mmol/L 4.0 3.5 3.4(L)  Chloride 98 - 110 mmol/L 102 102 101  CO2 20 - 31 mmol/L 26 27 30   Calcium 8.6 - 10.4 mg/dL 9.7 57.8 9.3    BNP No results found for: BNP  ProBNP No results found for: PROBNP  PFT    Component Value Date/Time   FEV1PRE 1.90 06/10/2015 0900   FEV1POST 1.90 06/10/2015 0900   FVCPRE 2.08 06/10/2015 0900   FVCPOST 2.05 06/10/2015 0900   TLC 3.48 06/10/2015 0900   DLCOUNC 14.80 06/10/2015 0900   PREFEV1FVCRT 91 06/10/2015 0900   PSTFEV1FVCRT 93 06/10/2015 0900    No results found.   Past medical hx Past Medical History:  Diagnosis Date  . Hypertension   . Obesity   . Sarcoidosis of lung (HCC)    uses Adavir and Albuterol for reactive airway but sarcoid reportedly not active; sees pulmology  Social History   Tobacco Use  . Smoking status: Never Smoker  . Smokeless tobacco: Never Used  Substance Use Topics  . Alcohol use: Yes    Comment: Rare  . Drug use: No    Terri Fitzgerald reports that she has never smoked. She has never used smokeless tobacco. She reports that she drinks alcohol. She reports that she does not use drugs.  Tobacco Cessation: Never smoker  Past surgical hx, Family hx, Social hx all reviewed.  Current Outpatient Medications on File Prior to Visit  Medication Sig  . Albuterol Sulfate (PROAIR RESPICLICK) 108 (90 Base) MCG/ACT AEPB Inhale 2 puffs into the lungs 4 (four)  times daily as needed.  Marland Kitchen. amLODipine (NORVASC) 5 MG tablet Take 1 tablet (5 mg total) by mouth daily.  . Biotin 1000 MCG tablet Take 1,000 mcg by mouth as needed.   . Calcium Carbonate-Vitamin D (CALCIUM PLUS VITAMIN D) 300-100 MG-UNIT CAPS Take 2 tablets by mouth daily.  . hydrocortisone 2.5 % cream APPLY TO AFFECTED AREA TWICE A DAY AS NEEDED  . Omega-3 1000 MG CAPS Take 2 capsules by mouth daily.  . valsartan-hydrochlorothiazide (DIOVAN-HCT) 320-25 MG tablet Take 1 tablet by mouth daily.   No current facility-administered medications on file prior to visit.      No Known Allergies  Review Of Systems:  Constitutional:   No  weight loss, night sweats,  Fevers, chills, fatigue, or  lassitude.  HEENT:   No headaches,  Difficulty swallowing,  Tooth/dental problems, or  Sore throat,                No sneezing, itching, ear ache, +nasal congestion,+ post nasal drip,   CV:  No chest pain,  Orthopnea, PND, + swelling in lower extremities, anasarca, dizziness, palpitations, syncope.   GI  No heartburn, indigestion, abdominal pain, nausea, vomiting, diarrhea, change in bowel habits, loss of appetite, bloody stools.   Resp: + shortness of breath with exertion or at rest.  + excess mucus, + productive cough,  + non-productive cough,  No coughing up of blood.  No change in color of mucus.  + wheezing.  No chest wall deformity  Skin: no rash or lesions.  GU: no dysuria, change in color of urine, no urgency or frequency.  No flank pain, no hematuria   MS:  No joint pain or swelling.  No decreased range of motion.  No back pain.  Psych:  No change in mood or affect. No depression or anxiety.  No memory loss.   Vital Signs BP 120/82 (BP Location: Left Arm, Cuff Size: Normal)   Pulse 98   Ht 5\' 2"  (1.575 m)   Wt 200 lb 12.8 oz (91.1 kg)   SpO2 94%   BMI 36.73 kg/m    Physical Exam:  General- No distress,  A&Ox3, pleasant ENT: No sinus tenderness, TM clear, pale nasal mucosa, no oral  exudate,+ post nasal drip, no LAN Cardiac: S1, S2, regular rate and rhythm, no murmur Chest: + wheeze/No rales/ dullness; no accessory muscle use, no nasal flaring, no sternal retractions, few rhonchi Abd.: Soft Non-tender, ND, BS +,Body mass index is 36.73 kg/m. Ext: No clubbing cyanosis, edema Neuro:  normal strength, MAE x 4, A&O x 3 Skin: No rashes, warm and dry Psych: normal mood and behavior   Assessment/Plan  Sarcoidosis New onset cough lasting the last 2 months. Xopenex neb now. We will treat you with a Prednisone taper; 10 mg tablets: 4 tabs x 2 days,  3 tabs x 2 days, 2 tabs x 2 days 1 tab x 2 days then stop. Hydromet 5 cc's at bedtime for sleep. Don't drive if sleepy. Start taking your Zyrtec 10 mg once daily without fail. We will check and ACE, CBC level today We will check a CXR today We will add pepcid 20 mg every night at bedtime. Continue Albuterol as needed for shortness of breath or wheezing. Continue Advair 2 puffs twice daily. Rinse mouth after use We will send in a prescription for Advair Continue using 2 puffs twice  Follow up in 1 week with Dr. Isaiah Serge or Maralyn Sago NP Make sure you have your yearly eye exam. Please contact office for sooner follow up if symptoms do not improve or worsen or seek emergency care    Seasonal allergies Resume Zyrtec daily  Addendum 10/04/2017 CXR shows some interstitial changes. There is no recent CT within the system.  HRCT ordered  to get a baseline and evaluate for ILD as patient has new onset cough and there is a 20% incidence of patients with sarcoid developing ILD. This was discussed with Dr. Isaiah Serge, and he agreed.   Bevelyn Ngo, NP 10/04/2017  9:57 AM

## 2017-10-04 NOTE — Addendum Note (Signed)
Addended by: Cydney OkAUGUSTIN, Lurena Naeve N on: 10/04/2017 10:08 AM   Modules accepted: Orders

## 2017-10-04 NOTE — Assessment & Plan Note (Signed)
Resume Zyrtec daily

## 2017-10-04 NOTE — Assessment & Plan Note (Signed)
New onset cough lasting the last 2 months. Xopenex neb now. We will treat you with a Prednisone taper; 10 mg tablets: 4 tabs x 2 days, 3 tabs x 2 days, 2 tabs x 2 days 1 tab x 2 days then stop. Hydromet 5 cc's at bedtime for sleep. Don't drive if sleepy. Start taking your Zyrtec 10 mg once daily without fail. We will check and ACE, CBC level today We will check a CXR today We will add pepcid 20 mg every night at bedtime. Continue Albuterol as needed for shortness of breath or wheezing. Continue Advair 2 puffs twice daily. Rinse mouth after use We will send in a prescription for Advair Continue using 2 puffs twice  Follow up in 1 week with Dr. Isaiah SergeMannam or Maralyn SagoSarah NP Make sure you have your yearly eye exam. Please contact office for sooner follow up if symptoms do not improve or worsen or seek emergency care

## 2017-10-04 NOTE — Telephone Encounter (Signed)
I saw Terri Fitzgerald today for  Cough. She does have some PND, but I was concerned she may be having a sarcoid flare. CXR shows ILD, and when I look back there was notation of fibrotic changes on CXr about 1 year ago. There was no CT chest recently.  I was going to consider a HRCT for a baseline study to look more closely at the ILD for a baseline study. Do you think that is reasonable?  Thanks

## 2017-10-04 NOTE — Patient Instructions (Addendum)
It is nice to meet you today. Xopenex neb now. We will treat you with a Prednisone taper; 10 mg tablets: 4 tabs x 2 days, 3 tabs x 2 days, 2 tabs x 2 days 1 tab x 2 days then stop. Hydromet 5 cc's at bedtime for sleep. Don't drive if sleepy. Start taking your Zyrtec 10 mg once daily without fail. We will check and ACE, CBC level today We will check a CXR today We will add pepcid 20 mg every night at bedtime. Continue Albuterol as needed for shortness of breath or wheezing. Continue Advair 2 puffs twice daily. Rinse mouth after use We will send in a prescription for Advair Continue using 2 puffs twice  Follow up in 1 week with Dr. Isaiah SergeMannam or Maralyn SagoSarah NP Make sure you have your yearly eye exam. Please contact office for sooner follow up if symptoms do not improve or worsen or seek emergency care

## 2017-10-05 NOTE — Telephone Encounter (Signed)
Yes. That is a good idea

## 2017-10-05 NOTE — Telephone Encounter (Signed)
Terri Fitzgerald, Please call patient and explain that I want to get a CT chest to better evaluate her lungs for some chronic changes ( there are no acute changes). Please place order for HRCT using ILD protocol. Thanks so much.

## 2017-10-06 LAB — ANGIOTENSIN CONVERTING ENZYME: Angiotensin-Converting Enzyme: 23 U/L (ref 9–67)

## 2017-10-07 NOTE — Addendum Note (Signed)
Addended by: Cydney OkAUGUSTIN, Ayline Dingus N on: 10/07/2017 10:57 AM   Modules accepted: Orders

## 2017-10-07 NOTE — Telephone Encounter (Signed)
Advised pt of results. Pt understood and nothing further is needed.    CT High Resolution ordered. Pt aware.

## 2017-10-15 ENCOUNTER — Encounter: Payer: Self-pay | Admitting: Nurse Practitioner

## 2017-10-15 ENCOUNTER — Ambulatory Visit (INDEPENDENT_AMBULATORY_CARE_PROVIDER_SITE_OTHER): Payer: Medicare Other | Admitting: Nurse Practitioner

## 2017-10-15 DIAGNOSIS — D869 Sarcoidosis, unspecified: Secondary | ICD-10-CM | POA: Diagnosis not present

## 2017-10-15 DIAGNOSIS — J302 Other seasonal allergic rhinitis: Secondary | ICD-10-CM

## 2017-10-15 NOTE — Progress Notes (Signed)
@Patient  ID: Terri Fitzgerald, female    DOB: October 08, 1950, 67 y.o.   MRN: [id number]  Chief Complaint  Patient presents with  . Follow-up    Sarcoidosis    Referring provider: Renford Dills, MD   HPI  67 year old female with pulmonary sarcoidosis followed by Dr. Isaiah Serge.   CXR 10/04/2017 Chronic interstitial lung disease without evidence of acute Abnormality. ACE 10/04/2017>> 23 ( Normal)  ACE levels 11/19/14 32 (normal)  PFTs 06/10/15 FVC 2.08 (90%),FEV1 1.90 [106%],F/F 91,TLC 73%,DLCO 60%. Minimal restriction, moderate diffusion impairment the corrects for alveolar volume.  CXR 06/10/15 Chronic changes without acute abnormality.   EKG 11/19/14- NSR, no acute changes D dimer 10/07/15- 0.24 LE dopplers 10/07/15- No DVT.  OV 10/15/17 - OV follow up - recent flare Patient was seen on 10/04/17 for cough and wheezing for 2 months. She was given neb treatment, prednisone taper, zyrtec. HRCT  has been scheduled. Patient states that she is doing well now. Denies any shortness of breath, fever, or chest pain. She is compliant with Advair.    No Known Allergies  Immunization History  Administered Date(s) Administered  . Influenza, High Dose Seasonal PF 10/25/2015  . Influenza,inj,Quad PF,6+ Mos 11/21/2012, 10/18/2014  . Pneumococcal Conjugate-13 08/04/2013  . Pneumococcal Polysaccharide-23 11/21/2012  . Tdap 09/11/2013  . Zoster 11/21/2012    Past Medical History:  Diagnosis Date  . Hypertension   . Obesity   . Sarcoidosis of lung (HCC)    uses Adavir and Albuterol for reactive airway but sarcoid reportedly not active; sees pulmology    Tobacco History: Social History   Tobacco Use  Smoking Status Never Smoker  Smokeless Tobacco Never Used   Counseling given: Yes   Outpatient Encounter Medications as of 10/15/2017  Medication Sig  . Albuterol Sulfate (PROAIR RESPICLICK) 108 (90 Base) MCG/ACT AEPB Inhale 2 puffs into the lungs 4 (four) times daily as needed.   Marland Kitchen amLODipine (NORVASC) 5 MG tablet Take 1 tablet (5 mg total) by mouth daily.  . Biotin 1000 MCG tablet Take 1,000 mcg by mouth as needed.   . Calcium Carbonate-Vitamin D (CALCIUM PLUS VITAMIN D) 300-100 MG-UNIT CAPS Take 2 tablets by mouth daily.  . Fluticasone-Salmeterol (ADVAIR DISKUS) 500-50 MCG/DOSE AEPB Inhale 1 puff into the lungs 2 (two) times daily.  Marland Kitchen HYDROcodone-homatropine (HYDROMET) 5-1.5 MG/5ML syrup Take 5 mLs by mouth every 6 (six) hours as needed for cough.  . hydrocortisone 2.5 % cream APPLY TO AFFECTED AREA TWICE A DAY AS NEEDED  . Omega-3 1000 MG CAPS Take 2 capsules by mouth daily.  . predniSONE (DELTASONE) 10 MG tablet Take 4 tabs for 2 days, then 3 tabs for 2 days, 2 tabs for 2 days, then 1 tab for 2 days, then stop.  . valsartan-hydrochlorothiazide (DIOVAN-HCT) 320-25 MG tablet Take 1 tablet by mouth daily.   No facility-administered encounter medications on file as of 10/15/2017.      Review of Systems  Review of Systems  Constitutional: Negative.  Negative for chills and fever.  HENT: Negative.  Negative for postnasal drip.   Respiratory: Negative for cough and shortness of breath.   Cardiovascular: Negative.   Gastrointestinal: Negative.   Allergic/Immunologic: Negative.   Neurological: Negative.   Psychiatric/Behavioral: Negative.        Physical Exam  BP 128/76 (BP Location: Left Arm, Patient Position: Sitting, Cuff Size: Normal)   Pulse 86   Ht 5\' 2"  (1.575 m)   Wt 205 lb 3.2 oz (93.1 kg)  SpO2 97%   BMI 37.53 kg/m   Wt Readings from Last 5 Encounters:  10/15/17 205 lb 3.2 oz (93.1 kg)  10/04/17 200 lb 12.8 oz (91.1 kg)  01/11/17 208 lb (94.3 kg)  12/16/15 201 lb (91.2 kg)  11/22/15 206 lb (93.4 kg)     Physical Exam  Constitutional: She is oriented to person, place, and time. She appears well-developed and well-nourished. No distress.  Cardiovascular: Normal rate and regular rhythm.  Pulmonary/Chest: Effort normal and breath sounds  normal.  Neurological: She is alert and oriented to person, place, and time.  Psychiatric: She has a normal mood and affect.  Nursing note and vitals reviewed.     Assessment & Plan:   Sarcoidosis Patient Instructions  Stable  Continue advair Continue albuterol Zyrtec daily Already has HRCT scheduled Routine follow up with Dr. Isaiah Serge in 2 months    Seasonal allergies Continue zyrtec daily     Ivonne Andrew, NP 10/15/2017

## 2017-10-15 NOTE — Patient Instructions (Addendum)
Stable  Continue advair Continue albuterol Zyrtec daily Already has HRCT scheduled Routine follow up with Dr. Isaiah Serge in 2 months

## 2017-10-15 NOTE — Assessment & Plan Note (Signed)
Patient Instructions  Stable  Continue advair Continue albuterol Zyrtec daily Already has HRCT scheduled Routine follow up with Dr. Isaiah Serge in 2 months

## 2017-10-15 NOTE — Assessment & Plan Note (Signed)
Continue zyrtec daily  

## 2017-10-18 DIAGNOSIS — Z23 Encounter for immunization: Secondary | ICD-10-CM | POA: Diagnosis not present

## 2017-10-18 DIAGNOSIS — H35033 Hypertensive retinopathy, bilateral: Secondary | ICD-10-CM | POA: Diagnosis not present

## 2017-10-19 ENCOUNTER — Ambulatory Visit (INDEPENDENT_AMBULATORY_CARE_PROVIDER_SITE_OTHER)
Admission: RE | Admit: 2017-10-19 | Discharge: 2017-10-19 | Disposition: A | Discharge status: Home / Self Care | Payer: Medicare Other | Source: Ambulatory Visit | Attending: Acute Care | Admitting: Acute Care

## 2017-10-19 DIAGNOSIS — J849 Interstitial pulmonary disease, unspecified: Secondary | ICD-10-CM | POA: Diagnosis not present

## 2017-10-19 DIAGNOSIS — J8410 Pulmonary fibrosis, unspecified: Secondary | ICD-10-CM | POA: Diagnosis not present

## 2017-10-26 ENCOUNTER — Telehealth: Payer: Self-pay

## 2017-10-26 DIAGNOSIS — D869 Sarcoidosis, unspecified: Secondary | ICD-10-CM

## 2017-10-26 NOTE — Telephone Encounter (Signed)
Pt is aware of results and voiced her understanding. Nothing further is needed.  

## 2017-10-26 NOTE — Telephone Encounter (Signed)
CT shows scarring in lung and mild inflammation that could be from sarcoid. The tests are to make sure there are no other conditions that could be causing it. We will review the results and CT in detail at time of clinic visit.

## 2017-10-26 NOTE — Telephone Encounter (Signed)
-----   Message from Chilton GreathousePraveen Mannam, MD sent at 10/25/2017 11:31 AM EDT ----- Regarding: RE: HRCT Andrey CotaLoretta Brown I have reviewed the CT scan. It could be from sarcoidosis.  Can get some basic labs including ANA IFA, RF, CCP, ACE level and HP panel. Derion Kreiter-can you order and let pt know. Will reassess at clinic visit.  ----- Message ----- From: Bevelyn NgoGroce, Sarah F, NP Sent: 10/21/2017  10:31 AM EDT To: Chilton GreathousePraveen Mannam, MD Subject: HRCT Andrey CotaLoretta Brown                             Dr. Isaiah SergeMannam, Here are the HRCT results for Ms. Manson PasseyBrown. She is a sarcoid patient, but looks like HRCT is  indeterminate for UIP favors chronic hypersensitivity pneumonitis per Entrikin. You see her 12/20/2017. Would you like any additional work up done prior to seeing her?  Thanks, Sarah ----- Message ----- From: Leory PlowmanInterface, Rad Results In Sent: 10/19/2017   4:06 PM EDT To: Bevelyn NgoSarah F Groce, NP

## 2017-10-26 NOTE — Telephone Encounter (Signed)
Pt is aware of below message and voiced her understanding. Labs have been placed.  Pt is requesting CT results.  Dr. Isaiah SergeMannam please advise. Thanks

## 2017-10-27 ENCOUNTER — Other Ambulatory Visit: Payer: Medicare Other

## 2017-10-27 DIAGNOSIS — D869 Sarcoidosis, unspecified: Secondary | ICD-10-CM | POA: Diagnosis not present

## 2017-10-28 LAB — ANGIOTENSIN CONVERTING ENZYME: Angiotensin-Converting Enzyme: 23 U/L (ref 9–67)

## 2017-10-29 LAB — ANA,IFA RA DIAG PNL W/RFLX TIT/PATN
Anti Nuclear Antibody(ANA): NEGATIVE
Rhuematoid fact SerPl-aCnc: <14 IU/mL (ref <14)

## 2017-11-01 LAB — ANA W/REFLEX: ANA: NEGATIVE

## 2017-11-01 LAB — HYPERSENSITIVITY PNEUMONITIS
A. Pullulans Abs: NEGATIVE
A.Fumigatus #1 Abs: NEGATIVE
Micropolyspora faeni, IgG: NEGATIVE
PIGEON SERUM ABS: NEGATIVE
THERMOACT. SACCHARII: NEGATIVE
Thermoactinomyces vulgaris, IgG: NEGATIVE

## 2017-11-29 DIAGNOSIS — J069 Acute upper respiratory infection, unspecified: Secondary | ICD-10-CM | POA: Diagnosis not present

## 2017-12-02 DIAGNOSIS — Z6838 Body mass index (BMI) 38.0-38.9, adult: Secondary | ICD-10-CM | POA: Diagnosis not present

## 2017-12-02 DIAGNOSIS — Z1231 Encounter for screening mammogram for malignant neoplasm of breast: Secondary | ICD-10-CM | POA: Diagnosis not present

## 2017-12-02 DIAGNOSIS — Z01419 Encounter for gynecological examination (general) (routine) without abnormal findings: Secondary | ICD-10-CM | POA: Diagnosis not present

## 2017-12-02 DIAGNOSIS — N950 Postmenopausal bleeding: Secondary | ICD-10-CM | POA: Diagnosis not present

## 2017-12-02 DIAGNOSIS — Z124 Encounter for screening for malignant neoplasm of cervix: Secondary | ICD-10-CM | POA: Diagnosis not present

## 2017-12-20 ENCOUNTER — Ambulatory Visit: Payer: Medicare Other | Admitting: Pulmonary Disease

## 2017-12-22 ENCOUNTER — Ambulatory Visit (INDEPENDENT_AMBULATORY_CARE_PROVIDER_SITE_OTHER): Payer: Medicare Other | Admitting: Pulmonary Disease

## 2017-12-22 ENCOUNTER — Encounter: Payer: Self-pay | Admitting: Pulmonary Disease

## 2017-12-22 VITALS — BP 124/74 | HR 83 | Ht 62.0 in | Wt 208.0 lb

## 2017-12-22 DIAGNOSIS — J849 Interstitial pulmonary disease, unspecified: Secondary | ICD-10-CM

## 2017-12-22 DIAGNOSIS — D869 Sarcoidosis, unspecified: Secondary | ICD-10-CM

## 2017-12-22 NOTE — Patient Instructions (Signed)
I have reviewed his CT scan which shows some scarring which could be from sarcoidosis We will keep a close watch on this Follow-up in 6 months with spirometry and diffusion capacity.

## 2017-12-22 NOTE — Progress Notes (Signed)
Ranae PalmsLoretta Brown Schlosser    [id number]    09/25/1950  Primary Care Physician:Polite, Windy Fastonald, MD  Referring Physician: Renford DillsPolite, Ronald, MD [address] [place], [place] [address]  Chief complaint:  Follow up for sarcoidosis  HPI: [name] is a 67 year old with diagnosis of pulmonary sarcoidosis. This was [place]. She has been followed by a pulmonologist there and has been on and off prednisone. She is stopped taking the prednisone in 2011 due to an improvement in her symptoms. She's asked her primary care to refer her to a pulmonary specialist for routine follow-up. She does not recall the [facility].  She has been prescribed Advair and albuterol. But she does not use this as she is asymptomatic. She does not have any extrapulmonary manifestations of sarcoid.   Pets: No pets Occupation: Retired Public house managersecretary therapist Exposures: Has some mold at home.  No hot tub, Jacuzzi or any other exposures. Smoking history: Never smoker Travel [place]: [place]  No significant recent travel.  Interim history: Symptoms are stable.  She has no need for any controller inhaler medication.  She has albuterol rescue inhaler uses this very rarely.  Outpatient Encounter Medications as of 12/22/2017  Medication Sig  . Albuterol Sulfate (PROAIR RESPICLICK) 108 (90 Base) MCG/ACT AEPB Inhale 2 puffs into the lungs 4 (four) times daily as needed.  Marland Kitchen. amLODipine (NORVASC) 5 MG tablet Take 1 tablet (5 mg total) by mouth daily.  . Calcium Carbonate-Vitamin D (CALCIUM PLUS VITAMIN D) 300-100 MG-UNIT CAPS Take 2 tablets by mouth daily.  . Fluticasone-Salmeterol (ADVAIR DISKUS) 500-50 MCG/DOSE AEPB Inhale 1 puff into the lungs 2 (two) times daily.  Marland Kitchen. HYDROcodone-homatropine (HYDROMET) 5-1.5 MG/5ML syrup Take 5 mLs by mouth every 6 (six) hours as needed for cough.  . hydrocortisone 2.5 % cream  APPLY TO AFFECTED AREA TWICE A DAY AS NEEDED  . Omega-3 1000 MG CAPS Take 2 capsules by mouth daily.  . valsartan-hydrochlorothiazide (DIOVAN-HCT) 320-25 MG tablet Take 1 tablet by mouth daily.  . [DISCONTINUED] Biotin 1000 MCG tablet Take 1,000 mcg by mouth as needed.   . [DISCONTINUED] predniSONE (DELTASONE) 10 MG tablet Take 4 tabs for 2 days, then 3 tabs for 2 days, 2 tabs for 2 days, then 1 tab for 2 days, then stop.   No facility-administered encounter medications on file as of 12/22/2017.    Physical Exam: Blood pressure 124/74, pulse 83, height 5\' 2"  (1.575 m), weight 208 lb (94.3 kg), SpO2 98 %. Gen:      No acute distress HEENT:  EOMI, sclera anicteric Neck:     No masses; no thyromegaly Lungs:    Clear to auscultation bilaterally; normal respiratory effort CV:         Regular rate and rhythm; no murmurs Abd:      + bowel sounds; soft, non-tender; no palpable masses, no distension Ext:    No edema; adequate peripheral perfusion Skin:      Warm and dry; no rash Neuro: alert and oriented x 3 Psych: normal mood and affect  Data Reviewed: Imaging CT high-resolution 10/19/2017-pulmonary fibrosis with no basal gradient.  No honeycombing.  Aortic atherosclerosis with left main disease. I have reviewed the images personally.  PFTs 06/10/15 FVC 2.08 (90%), FEV1 1.90 [106%], F/F 91, TLC 73%, DLCO 60%. Minimal restriction, moderate diffusion impairment the corrects for alveolar volume.  Labs  ACE levels 11/19/14 32 (normal) ACE levels 10/04/17 23 (normal)  Hypersensitivity panel 9/80/19-negative ANA, CCP, rheumatoid factor 9/80/19-negative  EKG 11/19/14- NSR, no acute changes D dimer 10/07/15- 0.24 LE dopplers 10/07/15- No DVT.  Assessment:  Pulmonary sarcoidosis. Diagnosis by transbronchial biopsy in 1990. She had previously been on prednisone but has been off it for the past 5 years.   CT shows pulmonary fibrosis.  This could be HP but her work-up was normal and she does not  have significant exposures except for minimal mold exposure We will continue to monitor this.  Follow-up with repeat PFTs  She is asymptomatic and does not need any treatment at present. Annual with annual ophthalmology follow-up Albuterol as needed  Aortic, coronary atherosclerosis Discussed the results with patient.  She prefers to discuss this further with her primary care.          Plan/Recommendations: - Albuterol as needed - Spirometry, diffusion capacity in 6 months.  Follow-up in 6 months  Chilton Greathouse MD [facility] Pager [phone] 12/22/2017, 9:15 AM  CC: Renford Dills, MD

## 2018-01-11 ENCOUNTER — Encounter: Payer: Self-pay | Admitting: Cardiology

## 2018-01-11 ENCOUNTER — Ambulatory Visit (INDEPENDENT_AMBULATORY_CARE_PROVIDER_SITE_OTHER): Payer: Medicare Other | Admitting: Cardiology

## 2018-01-11 VITALS — BP 112/60 | HR 96 | Ht 62.0 in | Wt 204.0 lb

## 2018-01-11 DIAGNOSIS — E782 Mixed hyperlipidemia: Secondary | ICD-10-CM | POA: Diagnosis not present

## 2018-01-11 DIAGNOSIS — I10 Essential (primary) hypertension: Secondary | ICD-10-CM | POA: Diagnosis not present

## 2018-01-11 DIAGNOSIS — D869 Sarcoidosis, unspecified: Secondary | ICD-10-CM

## 2018-01-11 DIAGNOSIS — I2510 Atherosclerotic heart disease of native coronary artery without angina pectoris: Secondary | ICD-10-CM | POA: Diagnosis not present

## 2018-01-11 HISTORY — DX: Atherosclerotic heart disease of native coronary artery without angina pectoris: I25.10

## 2018-01-11 NOTE — Patient Instructions (Signed)
Medication Instructions:  Your physician recommends that you continue on your current medications as directed. Please refer to the Current Medication list given to you today.  If you need a refill on your cardiac medications before your next appointment, please call your pharmacy.   Lab work: Your physician recommends that you have the following labs drawn: BMP, TSH, CBC, liver and lipid panel.  If you have labs (blood work) drawn today and your tests are completely normal, you will receive your results only by: Marland Kitchen. MyChart Message (if you have MyChart) OR . A paper copy in the mail If you have any lab test that is abnormal or we need to change your treatment, we will call you to review the results.  Testing/Procedures: Your physician has requested that you have an echocardiogram. Echocardiography is a painless test that uses sound waves to create images of your heart. It provides your doctor with information about the size and shape of your heart and how well your heart's chambers and valves are working. This procedure takes approximately one hour. There are no restrictions for this procedure.  Your physician has requested that you have en exercise stress myoview. For further information please visit [email or web address]. Please follow instruction sheet, as given.  Follow-Up: At [facility], you and your health needs are our priority.  As part of our continuing mission to provide you with exceptional heart care, we have created designated Provider Care Teams.  These Care Teams include your primary Cardiologist (physician) and Advanced Practice Providers (APPs -  Physician Assistants and Nurse Practitioners) who all work together to provide you with the care you need, when you need it.  You will need a follow up appointment in 6 months.  Please call our office 2 months in advance to schedule this appointment.  You may see another member of our [facility] Provider Team in [place]: Gypsy Balsamobert  Krasowski, MD . Norman HerrlichBrian Munley, MD  Any Other Special Instructions Will Be Listed Below (If Applicable).

## 2018-01-11 NOTE — Progress Notes (Signed)
Cardiology Office Note:    Date:  01/11/2018   ID:  Ranae Palms, DOB Nov 26, 1950, MRN [id number]  PCP:  Renford Dills, MD  Cardiologist:  Garwin Brothers, MD   Referring MD: Renford Dills, MD    ASSESSMENT:    1. Atherosclerosis of native coronary artery of native heart without angina pectoris   2. Essential hypertension   3. Sarcoidosis   4. Mixed hyperlipidemia    PLAN:    In order of problems listed above:  1. Secondary prevention stressed with the patient.  Importance of compliance with diet and medication stressed and she vocalized understanding.  Her blood pressure is stable.  Diet was discussed for dyslipidemia and obesity.  Risks of obesity explained and she vocalized understanding. 2. She is fasting today and we will do blood work including fasting lipids. 3. In view of shortness of breath she will undergo exercise stress Cardiolite. 4. Echocardiogram will be done to assess murmur heard on auscultation. 5. She has calcifications on the coronary arteries and therefore will need statin therapy and we will initiate this once her blood work is back. 6. Patient will be seen in follow-up appointment in 6 months or earlier if the patient has any concerns    Medication Adjustments/Labs and Tests Ordered: Current medicines are reviewed at length with the patient today.  Concerns regarding medicines are outlined above.  No orders of the defined types were placed in this encounter.  No orders of the defined types were placed in this encounter.    History of Present Illness:    Terri Fitzgerald is a 67 y.o. female who is being seen today for the evaluation of atherosclerosis and calcification of coronary arteries found incidentally on CT scan at the request of Renford Dills, MD.  Patient is a pleasant 67 year old female.  She has past medical history of sarcoidosis predominantly affecting the lung and interstitial fibrosis.  Patient mentions to me that she has  history of essential hypertension and dyslipidemia she denies any problems at this time and leads a sedentary lifestyle.  She has shortness of breath on exertion.  No chest pain orthopnea or PND.  In view of the fact that she has calcifications of coronary arteries she was sent here for evaluation.  At the time of my evaluation, the patient is alert awake oriented and in no distress.  Past Medical History:  Diagnosis Date  . Hypertension   . Obesity   . Sarcoidosis of lung (HCC)    uses Adavir and Albuterol for reactive airway but sarcoid reportedly not active; sees pulmology    Past Surgical History:  Procedure Laterality Date  . TUBAL LIGATION      Current Medications: Current Meds  Medication Sig  . Albuterol Sulfate (PROAIR RESPICLICK) 108 (90 Base) MCG/ACT AEPB Inhale 2 puffs into the lungs 4 (four) times daily as needed.  Marland Kitchen amLODipine (NORVASC) 5 MG tablet Take 1 tablet (5 mg total) by mouth daily.  . Calcium Carbonate-Vitamin D (CALCIUM PLUS VITAMIN D) 300-100 MG-UNIT CAPS Take 2 tablets by mouth daily.  . Fluticasone-Salmeterol (ADVAIR DISKUS) 500-50 MCG/DOSE AEPB Inhale 1 puff into the lungs 2 (two) times daily.  . hydrocortisone 2.5 % cream APPLY TO AFFECTED AREA TWICE A DAY AS NEEDED  . valsartan-hydrochlorothiazide (DIOVAN-HCT) 320-25 MG tablet Take 1 tablet by mouth daily.     Allergies:   Patient has no known allergies.   Social History   Socioeconomic History  . Marital status: Widowed  Spouse name: Not on file  . Number of children: Not on file  . Years of education: Not on file  . Highest education level: Not on file  Occupational History  . Not on file  Social Needs  . Financial resource strain: Not on file  . Food insecurity:    Worry: Not on file    Inability: Not on file  . Transportation needs:    Medical: Not on file    Non-medical: Not on file  Tobacco Use  . Smoking status: Never Smoker  . Smokeless tobacco: Never Used  Substance and Sexual  Activity  . Alcohol use: Yes    Comment: Rare  . Drug use: No  . Sexual activity: Not Currently  Lifestyle  . Physical activity:    Days per week: Not on file    Minutes per session: Not on file  . Stress: Not on file  Relationships  . Social connections:    Talks on phone: Not on file    Gets together: Not on file    Attends religious service: Not on file    Active member of club or organization: Not on file    Attends meetings of clubs or organizations: Not on file    Relationship status: Not on file  Other Topics Concern  . Not on file  Social History Narrative  . Not on file     Family History: The patient's family history includes Asthma in her maternal grandmother; Prostate cancer in her brother.  ROS:   Please see the history of present illness.    All other systems reviewed and are negative.  EKGs/Labs/Other Studies Reviewed:    The following studies were reviewed today: EKG reveals sinus rhythm and nonspecific ST-T changes.   Recent Labs: No results found for requested labs within last 8760 hours.  Recent Lipid Panel    Component Value Date/Time   CHOL 190 11/22/2015 0956   TRIG 97 11/22/2015 0956   HDL 70 11/22/2015 0956   CHOLHDL 2.7 11/22/2015 0956   VLDL 19 11/22/2015 0956   LDLCALC 101 11/22/2015 0956    Physical Exam:    VS:  BP 112/60 (BP Location: Right Arm, Patient Position: Sitting, Cuff Size: Normal)   Pulse 96   Ht 5\' 2"  (1.575 m)   Wt 204 lb (92.5 kg)   SpO2 96%   BMI 37.31 kg/m     Wt Readings from Last 3 Encounters:  01/11/18 204 lb (92.5 kg)  12/22/17 208 lb (94.3 kg)  10/15/17 205 lb 3.2 oz (93.1 kg)     GEN: Patient is in no acute distress HEENT: Normal NECK: No JVD; No carotid bruits LYMPHATICS: No lymphadenopathy CARDIAC: S1 S2 regular, 2/6 systolic murmur at the apex. RESPIRATORY:  Clear to auscultation without rales, wheezing or rhonchi  ABDOMEN: Soft, non-tender, non-distended MUSCULOSKELETAL:  No edema; No  deformity  SKIN: Warm and dry NEUROLOGIC:  Alert and oriented x 3 PSYCHIATRIC:  Normal affect    Signed, Garwin Brothersajan R Leilyn Frayre, MD  01/11/2018 11:42 AM    [facility]

## 2018-01-12 ENCOUNTER — Telehealth: Payer: Self-pay

## 2018-01-12 DIAGNOSIS — D869 Sarcoidosis, unspecified: Secondary | ICD-10-CM

## 2018-01-12 DIAGNOSIS — I2510 Atherosclerotic heart disease of native coronary artery without angina pectoris: Secondary | ICD-10-CM

## 2018-01-12 DIAGNOSIS — E782 Mixed hyperlipidemia: Secondary | ICD-10-CM

## 2018-01-12 DIAGNOSIS — I10 Essential (primary) hypertension: Secondary | ICD-10-CM

## 2018-01-12 LAB — HEPATIC FUNCTION PANEL
ALK PHOS: 95 IU/L (ref 39–117)
ALT: 25 IU/L (ref 0–32)
AST: 20 IU/L (ref 0–40)
Albumin: 4.5 g/dL (ref 3.6–4.8)
BILIRUBIN TOTAL: 0.5 mg/dL (ref 0.0–1.2)
BILIRUBIN, DIRECT: 0.15 mg/dL (ref 0.00–0.40)
Total Protein: 7.7 g/dL (ref 6.0–8.5)

## 2018-01-12 LAB — CBC WITH DIFFERENTIAL/PLATELET
Basophils Absolute: 0 10*3/uL (ref 0.0–0.2)
Basos: 1 %
EOS (ABSOLUTE): 0.1 10*3/uL (ref 0.0–0.4)
Eos: 1 %
HEMOGLOBIN: 13.5 g/dL (ref 11.1–15.9)
Hematocrit: 40.3 % (ref 34.0–46.6)
IMMATURE GRANS (ABS): 0 10*3/uL (ref 0.0–0.1)
Immature Granulocytes: 1 %
LYMPHS: 37 %
Lymphocytes Absolute: 3.2 10*3/uL — ABNORMAL HIGH (ref 0.7–3.1)
MCH: 29.5 pg (ref 26.6–33.0)
MCHC: 33.5 g/dL (ref 31.5–35.7)
MCV: 88 fL (ref 79–97)
Monocytes Absolute: 0.5 10*3/uL (ref 0.1–0.9)
Monocytes: 6 %
NEUTROS ABS: 4.8 10*3/uL (ref 1.4–7.0)
Neutrophils: 54 %
Platelets: 225 10*3/uL (ref 150–450)
RBC: 4.58 x10E6/uL (ref 3.77–5.28)
RDW: 13.4 % (ref 12.3–15.4)
WBC: 8.8 10*3/uL (ref 3.4–10.8)

## 2018-01-12 LAB — BASIC METABOLIC PANEL
BUN / CREAT RATIO: 22 (ref 12–28)
BUN: 20 mg/dL (ref 8–27)
CALCIUM: 10 mg/dL (ref 8.7–10.3)
CO2: 24 mmol/L (ref 20–29)
Chloride: 99 mmol/L (ref 96–106)
Creatinine, Ser: 0.9 mg/dL (ref 0.57–1.00)
GFR calc non Af Amer: 66 mL/min/{1.73_m2} (ref >59)
GFR, EST AFRICAN AMERICAN: 77 mL/min/{1.73_m2} (ref >59)
Glucose: 116 mg/dL — ABNORMAL HIGH (ref 65–99)
POTASSIUM: 3.4 mmol/L — AB (ref 3.5–5.2)
Sodium: 139 mmol/L (ref 134–144)

## 2018-01-12 LAB — TSH: TSH: 0.773 u[IU]/mL (ref 0.450–4.500)

## 2018-01-12 LAB — LIPID PANEL
Chol/HDL Ratio: 3 ratio (ref 0.0–4.4)
Cholesterol, Total: 221 mg/dL — ABNORMAL HIGH (ref 100–199)
HDL: 73 mg/dL (ref >39)
LDL Calculated: 130 mg/dL — ABNORMAL HIGH (ref 0–99)
Triglycerides: 88 mg/dL (ref 0–149)
VLDL Cholesterol Cal: 18 mg/dL (ref 5–40)

## 2018-01-12 MED ORDER — ATORVASTATIN CALCIUM 20 MG PO TABS: 20.0000 mg | ORAL_TABLET | Freq: Every day | ORAL | 3 refills | Status: DC

## 2018-01-12 NOTE — Telephone Encounter (Signed)
Patient called and notified of lab results. Patient is aware of new medication and will return for labs in 6 weeks

## 2018-01-18 ENCOUNTER — Telehealth (HOSPITAL_COMMUNITY): Payer: Self-pay | Admitting: *Deleted

## 2018-01-18 NOTE — Telephone Encounter (Signed)
Patient given detailed instructions per Myocardial Perfusion Study Information Sheet for the test on 01/24/18 at 1000. Patient notified to arrive 15 minutes early and that it is imperative to arrive on time for appointment to keep from having the test rescheduled.  If you need to cancel or reschedule your appointment, please call the office within 24 hours of your appointment. . Patient verbalized understanding.Margarita Bobrowski, Adelene IdlerCynthia W

## 2018-01-21 ENCOUNTER — Encounter (HOSPITAL_COMMUNITY): Payer: Medicare Other

## 2018-01-21 ENCOUNTER — Other Ambulatory Visit (HOSPITAL_COMMUNITY): Payer: Medicare Other

## 2018-01-21 DIAGNOSIS — I2510 Atherosclerotic heart disease of native coronary artery without angina pectoris: Secondary | ICD-10-CM | POA: Diagnosis not present

## 2018-01-21 DIAGNOSIS — D860 Sarcoidosis of lung: Secondary | ICD-10-CM | POA: Diagnosis not present

## 2018-01-21 DIAGNOSIS — I10 Essential (primary) hypertension: Secondary | ICD-10-CM | POA: Diagnosis not present

## 2018-01-21 DIAGNOSIS — Z0000 Encounter for general adult medical examination without abnormal findings: Secondary | ICD-10-CM | POA: Diagnosis not present

## 2018-01-21 DIAGNOSIS — Z1389 Encounter for screening for other disorder: Secondary | ICD-10-CM | POA: Diagnosis not present

## 2018-01-24 ENCOUNTER — Ambulatory Visit (HOSPITAL_COMMUNITY): Payer: Medicare Other | Attending: Cardiology

## 2018-01-24 ENCOUNTER — Ambulatory Visit (HOSPITAL_BASED_OUTPATIENT_CLINIC_OR_DEPARTMENT_OTHER): Payer: Medicare Other

## 2018-01-24 VITALS — Ht 62.0 in | Wt 204.0 lb

## 2018-01-24 DIAGNOSIS — I10 Essential (primary) hypertension: Secondary | ICD-10-CM | POA: Insufficient documentation

## 2018-01-24 DIAGNOSIS — I2510 Atherosclerotic heart disease of native coronary artery without angina pectoris: Secondary | ICD-10-CM | POA: Diagnosis not present

## 2018-01-24 DIAGNOSIS — D869 Sarcoidosis, unspecified: Secondary | ICD-10-CM | POA: Diagnosis not present

## 2018-01-24 MED ORDER — TECHNETIUM TC 99M TETROFOSMIN IV KIT
32.7000 | PACK | Freq: Once | INTRAVENOUS | Status: AC | PRN
  Administered 2018-01-24: 32.7 via INTRAVENOUS
  Filled 2018-01-24: qty 33

## 2018-01-25 ENCOUNTER — Ambulatory Visit (HOSPITAL_COMMUNITY): Payer: Medicare Other | Attending: Internal Medicine

## 2018-01-25 DIAGNOSIS — R002 Palpitations: Secondary | ICD-10-CM | POA: Insufficient documentation

## 2018-01-25 DIAGNOSIS — I2510 Atherosclerotic heart disease of native coronary artery without angina pectoris: Secondary | ICD-10-CM | POA: Diagnosis not present

## 2018-01-25 DIAGNOSIS — R0600 Dyspnea, unspecified: Secondary | ICD-10-CM | POA: Insufficient documentation

## 2018-01-25 DIAGNOSIS — I10 Essential (primary) hypertension: Secondary | ICD-10-CM | POA: Insufficient documentation

## 2018-01-25 LAB — MYOCARDIAL PERFUSION IMAGING
CHL CUP NUCLEAR SDS: 0
CHL CUP NUCLEAR SSS: 0
CSEPED: 5 min
CSEPHR: 95 %
CSEPPHR: 146 {beats}/min
Estimated workload: 7 METS
Exercise duration (sec): 1 s
LV dias vol: 70 mL (ref 46–106)
LV sys vol: 22 mL
MPHR: 153 {beats}/min
RPE: 18
Rest HR: 73 {beats}/min
SRS: 0
TID: 0.84

## 2018-01-25 MED ORDER — TECHNETIUM TC 99M TETROFOSMIN IV KIT
27.3000 | PACK | Freq: Once | INTRAVENOUS | Status: AC | PRN
  Administered 2018-01-25: 27.3 via INTRAVENOUS
  Filled 2018-01-25: qty 28

## 2018-01-26 ENCOUNTER — Telehealth: Payer: Self-pay

## 2018-01-26 NOTE — Telephone Encounter (Signed)
Patient was called and notified of test results. 

## 2018-01-26 NOTE — Telephone Encounter (Signed)
-----   Message from Garwin Brothersajan R Revankar, MD sent at 01/25/2018  1:07 PM EST ----- The results of the study is unremarkable. Please inform patient. I will discuss in detail at next appointment. Cc  primary care/referring physician Garwin Brothersajan R Revankar, MD 01/25/2018 1:07 PM

## 2018-02-24 ENCOUNTER — Other Ambulatory Visit: Payer: Self-pay | Admitting: Cardiology

## 2018-02-24 DIAGNOSIS — E782 Mixed hyperlipidemia: Secondary | ICD-10-CM | POA: Diagnosis not present

## 2018-02-24 DIAGNOSIS — D869 Sarcoidosis, unspecified: Secondary | ICD-10-CM | POA: Diagnosis not present

## 2018-02-24 DIAGNOSIS — I10 Essential (primary) hypertension: Secondary | ICD-10-CM | POA: Diagnosis not present

## 2018-02-24 DIAGNOSIS — I2510 Atherosclerotic heart disease of native coronary artery without angina pectoris: Secondary | ICD-10-CM | POA: Diagnosis not present

## 2018-02-25 LAB — LIPID PANEL
Chol/HDL Ratio: 2.2 ratio (ref 0.0–4.4)
Cholesterol, Total: 155 mg/dL (ref 100–199)
HDL: 72 mg/dL (ref >39)
LDL Calculated: 67 mg/dL (ref 0–99)
Triglycerides: 80 mg/dL (ref 0–149)
VLDL Cholesterol Cal: 16 mg/dL (ref 5–40)

## 2018-02-25 LAB — BASIC METABOLIC PANEL
BUN/Creatinine Ratio: 20 (ref 12–28)
BUN: 16 mg/dL (ref 8–27)
CO2: 25 mmol/L (ref 20–29)
Calcium: 9.8 mg/dL (ref 8.7–10.3)
Chloride: 101 mmol/L (ref 96–106)
Creatinine, Ser: 0.82 mg/dL (ref 0.57–1.00)
GFR calc Af Amer: 86 mL/min/{1.73_m2} (ref >59)
GFR calc non Af Amer: 74 mL/min/{1.73_m2} (ref >59)
Glucose: 109 mg/dL — ABNORMAL HIGH (ref 65–99)
Potassium: 3.6 mmol/L (ref 3.5–5.2)
Sodium: 140 mmol/L (ref 134–144)

## 2018-02-25 LAB — HEPATIC FUNCTION PANEL
ALT: 21 IU/L (ref 0–32)
AST: 18 IU/L (ref 0–40)
Albumin: 4.1 g/dL (ref 3.6–4.8)
Alkaline Phosphatase: 91 IU/L (ref 39–117)
Bilirubin Total: 0.5 mg/dL (ref 0.0–1.2)
Bilirubin, Direct: 0.16 mg/dL (ref 0.00–0.40)
Total Protein: 7.3 g/dL (ref 6.0–8.5)

## 2018-05-27 ENCOUNTER — Telehealth: Payer: Self-pay | Admitting: *Deleted

## 2018-05-27 ENCOUNTER — Telehealth: Payer: Self-pay

## 2018-05-27 NOTE — Telephone Encounter (Signed)

## 2018-05-27 NOTE — Telephone Encounter (Signed)
Sent consent to pt through mychart.

## 2018-05-27 NOTE — Telephone Encounter (Signed)
Left message for patient to call back to schedule 6 mo f/u (recall)

## 2018-06-20 ENCOUNTER — Other Ambulatory Visit: Payer: Self-pay

## 2018-06-20 ENCOUNTER — Telehealth (INDEPENDENT_AMBULATORY_CARE_PROVIDER_SITE_OTHER): Payer: Medicare Other | Admitting: Cardiology

## 2018-06-20 ENCOUNTER — Encounter: Payer: Self-pay | Admitting: Cardiology

## 2018-06-20 VITALS — BP 155/101 | HR 91 | Ht 62.0 in | Wt 205.0 lb

## 2018-06-20 DIAGNOSIS — I10 Essential (primary) hypertension: Secondary | ICD-10-CM | POA: Diagnosis not present

## 2018-06-20 DIAGNOSIS — E782 Mixed hyperlipidemia: Secondary | ICD-10-CM

## 2018-06-20 DIAGNOSIS — I2510 Atherosclerotic heart disease of native coronary artery without angina pectoris: Secondary | ICD-10-CM

## 2018-06-20 DIAGNOSIS — D869 Sarcoidosis, unspecified: Secondary | ICD-10-CM

## 2018-06-20 NOTE — Patient Instructions (Signed)
Medication Instructions:  Your physician recommends that you continue on your current medications as directed. Please refer to the Current Medication list given to you today.  If you need a refill on your cardiac medications before your next appointment, please call your pharmacy.   Lab work: None If you have labs (blood work) drawn today and your tests are completely normal, you will receive your results only by: Marland Kitchen MyChart Message (if you have MyChart) OR . A paper copy in the mail If you have any lab test that is abnormal or we need to change your treatment, we will call you to review the results.  Testing/Procedures:None  Follow-Up: At [facility], you and your health needs are our priority.  As part of our continuing mission to provide you with exceptional heart care, we have created designated Provider Care Teams.  These Care Teams include your primary Cardiologist (physician) and Advanced Practice Providers (APPs -  Physician Assistants and Nurse Practitioners) who all work together to provide you with the care you need, when you need it. You will need a follow up appointment in 6 months.  Any Other Special Instructions Will Be Listed Below (If Applicable).  PLEASE CHECK BLOOD PRESSURE DAILY FOR A WEEK AT THE SAME TIME OF DAY. IF SYSTOLIC (TOP NUMBER) IS GREATER THAN 130 , MAIL IN THE RESULTS TO THE OFFICE

## 2018-06-20 NOTE — Progress Notes (Signed)
Virtual Visit via Video Note   This visit type was conducted due to national recommendations for restrictions regarding the COVID-19 Pandemic (e.g. social distancing) in an effort to limit this patient's exposure and mitigate transmission in our community.  Due to her co-morbid illnesses, this patient is at least at moderate risk for complications without adequate follow up.  This format is felt to be most appropriate for this patient at this time.  All issues noted in this document were discussed and addressed.  A limited physical exam was performed with this format.  Please refer to the patient's chart for her consent to telehealth for [facility].   Date:  06/20/2018   ID:  Terri Fitzgerald, DOB 12/01/1950, MRN [id number]  Patient Location: Home Provider Location: Office  PCP:  Renford DillsPolite, Ronald, MD  Cardiologist:  No primary care provider on file.  Electrophysiologist:  None   Evaluation Performed:  Follow-Up Visit  Chief Complaint: Essential hypertension  History of Present Illness:    Terri PalmsLoretta Brown Yeagle is a 68 y.o. female with past medical history of essential hypertension and dyslipidemia.  Patient mentions to me that she overall leads a sedentary lifestyle.  No chest pain orthopnea or PND.  Her blood pressure is elevated today.  She mentions to me that the last time she checked it it was in the range of 120 systolic and 70 diastolic.  At the time of my evaluation, the patient is alert awake oriented and in no distress.  The patient does not have symptoms concerning for COVID-19 infection (fever, chills, cough, or new shortness of breath).    Past Medical History:  Diagnosis Date  . Hypertension   . Obesity   . Sarcoidosis of lung (HCC)    uses Adavir and Albuterol for reactive airway but sarcoid reportedly not active; sees pulmology   Past Surgical History:  Procedure Laterality Date  . TUBAL LIGATION       Current Meds  Medication Sig  . Albuterol Sulfate  (PROAIR RESPICLICK) 108 (90 Base) MCG/ACT AEPB Inhale 2 puffs into the lungs 4 (four) times daily as needed.  Marland Kitchen. amLODipine (NORVASC) 5 MG tablet Take 1 tablet (5 mg total) by mouth daily.  . Fluticasone-Salmeterol (ADVAIR DISKUS) 500-50 MCG/DOSE AEPB Inhale 1 puff into the lungs 2 (two) times daily.  . hydrocortisone 2.5 % cream APPLY TO AFFECTED AREA TWICE A DAY AS NEEDED  . valsartan-hydrochlorothiazide (DIOVAN-HCT) 320-25 MG tablet Take 1 tablet by mouth daily.  . [DISCONTINUED] Calcium Carbonate-Vitamin D (CALCIUM PLUS VITAMIN D) 300-100 MG-UNIT CAPS Take 2 tablets by mouth daily.     Allergies:   Patient has no known allergies.   Social History   Tobacco Use  . Smoking status: Never Smoker  . Smokeless tobacco: Never Used  Substance Use Topics  . Alcohol use: Yes    Comment: Rare  . Drug use: No     Family Hx: The patient's family history includes Asthma in her maternal grandmother; Prostate cancer in her brother.  ROS:   Please see the history of present illness.    As mentioned above All other systems reviewed and are negative.   Prior CV studies:   The following studies were reviewed today:  Results of the stress test and echocardiogram were discussed with the patient at extensive length  Labs/Other Tests and Data Reviewed:    EKG:  No ECG reviewed.  Recent Labs: 01/11/2018: Hemoglobin 13.5; Platelets 225; TSH 0.773 02/24/2018: ALT 21; BUN 16; Creatinine, Ser  0.82; Potassium 3.6; Sodium 140   Recent Lipid Panel Lab Results  Component Value Date/Time   CHOL 155 02/24/2018 10:17 AM   TRIG 80 02/24/2018 10:17 AM   HDL 72 02/24/2018 10:17 AM   CHOLHDL 2.2 02/24/2018 10:17 AM   CHOLHDL 2.7 11/22/2015 09:56 AM   LDLCALC 67 02/24/2018 10:17 AM    Wt Readings from Last 3 Encounters:  06/20/18 205 lb (93 kg)  01/24/18 204 lb (92.5 kg)  01/11/18 204 lb (92.5 kg)     Objective:    Vital Signs:  BP (!) 155/101 (BP Location: Left Arm, Patient Position: Sitting,  Cuff Size: Normal)   Pulse 91   Ht 5\' 2"  (1.575 m)   Wt 205 lb (93 kg)   BMI 37.49 kg/m    VITAL SIGNS:  reviewed  ASSESSMENT & PLAN:    1. Essential hypertension: Her blood pressure is stable.  Diet was discussed especially salt intake.  Importance of regular exercise stressed.  She vocalized understanding and plans to do so.  She tells me that she will go to her primary care doctor and get blood work in the next few days. 2. Mixed dyslipidemia.  Diet was discussed and importance of compliance with exercise stressed again.  She will have this blood work done by her primary care physician. 3. In view of the fact that her blood pressure is elevated I told her to keep a track of blood pressures in the next week.  If her systolic is more than 130 she will put it in the mail for me to titrate her medications. 4. Patient will be seen in follow-up appointment in 6 months or earlier if the patient has any concerns   COVID-19 Education: The signs and symptoms of COVID-19 were discussed with the patient and how to seek care for testing (follow up with PCP or arrange E-visit).  The importance of social distancing was discussed today.  Time:   Today, I have spent 16 minutes with the patient with telehealth technology discussing the above problems.     Medication Adjustments/Labs and Tests Ordered: Current medicines are reviewed at length with the patient today.  Concerns regarding medicines are outlined above.   Tests Ordered: No orders of the defined types were placed in this encounter.   Medication Changes: No orders of the defined types were placed in this encounter.   Disposition:  Follow up in 6 month(s)  Signed, Garwin Brothers, MD  06/20/2018 10:33 AM    [facility]

## 2018-09-26 DIAGNOSIS — Z23 Encounter for immunization: Secondary | ICD-10-CM | POA: Diagnosis not present

## 2018-12-22 DIAGNOSIS — Z1231 Encounter for screening mammogram for malignant neoplasm of breast: Secondary | ICD-10-CM | POA: Diagnosis not present

## 2018-12-22 DIAGNOSIS — Z01419 Encounter for gynecological examination (general) (routine) without abnormal findings: Secondary | ICD-10-CM | POA: Diagnosis not present

## 2018-12-22 DIAGNOSIS — Z6838 Body mass index (BMI) 38.0-38.9, adult: Secondary | ICD-10-CM | POA: Diagnosis not present

## 2018-12-30 ENCOUNTER — Encounter: Payer: Self-pay | Admitting: Cardiology

## 2018-12-30 ENCOUNTER — Ambulatory Visit (INDEPENDENT_AMBULATORY_CARE_PROVIDER_SITE_OTHER): Payer: Medicare Other | Admitting: Cardiology

## 2018-12-30 ENCOUNTER — Other Ambulatory Visit: Payer: Self-pay

## 2018-12-30 VITALS — BP 118/62 | HR 75 | Ht 62.0 in | Wt 199.1 lb

## 2018-12-30 DIAGNOSIS — I10 Essential (primary) hypertension: Secondary | ICD-10-CM | POA: Diagnosis not present

## 2018-12-30 DIAGNOSIS — Z1329 Encounter for screening for other suspected endocrine disorder: Secondary | ICD-10-CM

## 2018-12-30 DIAGNOSIS — I2510 Atherosclerotic heart disease of native coronary artery without angina pectoris: Secondary | ICD-10-CM

## 2018-12-30 DIAGNOSIS — D869 Sarcoidosis, unspecified: Secondary | ICD-10-CM | POA: Diagnosis not present

## 2018-12-30 DIAGNOSIS — E782 Mixed hyperlipidemia: Secondary | ICD-10-CM

## 2018-12-30 MED ORDER — ROSUVASTATIN CALCIUM 5 MG PO TABS: 5.0000 mg | ORAL_TABLET | Freq: Every day | ORAL | 3 refills | Status: DC

## 2018-12-30 NOTE — Progress Notes (Signed)
Cardiology Office Note:    Date:  12/30/2018   ID:  Terri Fitzgerald, DOB Mar 21, 1950, MRN [id number]  PCP:  Seward Carol, MD  Cardiologist:  Jenean Lindau, MD   Referring MD: Seward Carol, MD    ASSESSMENT:    1. Atherosclerosis of native coronary artery of native heart without angina pectoris   2. Essential hypertension   3. Mixed hyperlipidemia   4. Sarcoidosis    PLAN:    In order of problems listed above:  1. Coronary arteriosclerosis: Secondary prevention stressed with the patient.  Importance of compliance with diet and medication stressed and he vocalized understanding. 2. Essential hypertension: Blood pressure is stable.  Diet was discussed importance of regular exercise stressed 3. Mixed dyslipidemia and obesity: Diet and the importance of regular exercise stressed.  I told her not to stop any medications without consulting with [place].  She will have complete blood work today including fasting lipids.  I will start her on Crestor 5 mg daily and she will be back in 6 weeks for liver lipid check. 4. Patient will be seen in follow-up appointment in 6 months or earlier if the patient has any concerns    Medication Adjustments/Labs and Tests Ordered: Current medicines are reviewed at length with the patient today.  Concerns regarding medicines are outlined above.  No orders of the defined types were placed in this encounter.  No orders of the defined types were placed in this encounter.    No chief complaint on file.    History of Present Illness:    Terri Fitzgerald is a 68 y.o. female.  Patient has past medical history of coronary atherosclerosis, essential hypertension, dyslipidemia and obesity.  She has sarcoidosis.  She denies any problems at this time and takes care of activities of daily living.  No chest pain orthopnea or PND.  At the time of my evaluation, the patient is alert awake oriented and in no distress.  She stopped taking atorvastatin because  she says she has shortness of breath and she was taking it.  Past Medical History:  Diagnosis Date  . Hypertension   . Obesity   . Sarcoidosis of lung ([place])    uses Adavir and Albuterol for reactive airway but sarcoid reportedly not active; sees pulmology    Past Surgical History:  Procedure Laterality Date  . TUBAL LIGATION      Current Medications: Current Meds  Medication Sig  . Albuterol Sulfate (PROAIR RESPICLICK) 983 (90 Base) MCG/ACT AEPB Inhale 2 puffs into the lungs 4 (four) times daily as needed.  Marland Kitchen amLODipine (NORVASC) 5 MG tablet Take 1 tablet (5 mg total) by mouth daily.  . calcium carbonate (OSCAL) 1500 (600 Ca) MG TABS tablet Take by mouth 2 (two) times daily with a meal.  . Fluticasone-Salmeterol (ADVAIR DISKUS) 500-50 MCG/DOSE AEPB Inhale 1 puff into the lungs 2 (two) times daily.  . hydrocortisone 2.5 % cream APPLY TO AFFECTED AREA TWICE A DAY AS NEEDED  . valsartan-hydrochlorothiazide (DIOVAN-HCT) 320-25 MG tablet Take 1 tablet by mouth daily.  . vitamin E 100 UNIT capsule Take by mouth daily.     Allergies:   Patient has no known allergies.   Social History   Socioeconomic History  . Marital status: Widowed    Spouse name: Not on file  . Number of children: Not on file  . Years of education: Not on file  . Highest education level: Not on file  Occupational History  . Not on  file  Social Needs  . Financial resource strain: Not on file  . Food insecurity    Worry: Not on file    Inability: Not on file  . Transportation needs    Medical: Not on file    Non-medical: Not on file  Tobacco Use  . Smoking status: Never Smoker  . Smokeless tobacco: Never Used  Substance and Sexual Activity  . Alcohol use: Yes    Comment: Rare  . Drug use: No  . Sexual activity: Not Currently  Lifestyle  . Physical activity    Days per week: Not on file    Minutes per session: Not on file  . Stress: Not on file  Relationships  . Social Musician on  phone: Not on file    Gets together: Not on file    Attends religious service: Not on file    Active member of club or organization: Not on file    Attends meetings of clubs or organizations: Not on file    Relationship status: Not on file  Other Topics Concern  . Not on file  Social History Narrative  . Not on file     Family History: The patient's family history includes Asthma in her maternal grandmother; Prostate cancer in her brother.  ROS:   Please see the history of present illness.    All other systems reviewed and are negative.  EKGs/Labs/Other Studies Reviewed:    The following studies were reviewed today: EKG reveals sinus rhythm and nonspecific ST-T changes   Recent Labs: 01/11/2018: Hemoglobin 13.5; Platelets 225; TSH 0.773 02/24/2018: ALT 21; BUN 16; Creatinine, Ser 0.82; Potassium 3.6; Sodium 140  Recent Lipid Panel    Component Value Date/Time   CHOL 155 02/24/2018 1017   TRIG 80 02/24/2018 1017   HDL 72 02/24/2018 1017   CHOLHDL 2.2 02/24/2018 1017   CHOLHDL 2.7 11/22/2015 0956   VLDL 19 11/22/2015 0956   LDLCALC 67 02/24/2018 1017    Physical Exam:    VS:  BP 118/62 (BP Location: Left Arm, Patient Position: Sitting, Cuff Size: Normal)   Pulse 75   Ht 5\' 2"  (1.575 m)   Wt 199 lb 1.9 oz (90.3 kg)   SpO2 98%   BMI 36.42 kg/m     Wt Readings from Last 3 Encounters:  12/30/18 199 lb 1.9 oz (90.3 kg)  06/20/18 205 lb (93 kg)  01/24/18 204 lb (92.5 kg)     GEN: Patient is in no acute distress HEENT: Normal NECK: No JVD; No carotid bruits LYMPHATICS: No lymphadenopathy CARDIAC: Hear sounds regular, 2/6 systolic murmur at the apex. RESPIRATORY:  Clear to auscultation without rales, wheezing or rhonchi  ABDOMEN: Soft, non-tender, non-distended MUSCULOSKELETAL:  No edema; No deformity  SKIN: Warm and dry NEUROLOGIC:  Alert and oriented x 3 PSYCHIATRIC:  Normal affect   Signed, 01/26/18, MD  12/30/2018 8:32 AM    [facility]

## 2018-12-30 NOTE — Patient Instructions (Signed)
Medication Instructions:  Your physician has recommended you make the following change in your medication: START taking crestor 5 mg (1 tablet) once daily  *If you need a refill on your cardiac medications before your next appointment, please call your pharmacy*  Lab Work: Your physician recommends that you have a BMP, CBC, TSH, hepatic and lipid drawn today.  If you have labs (blood work) drawn today and your tests are completely normal, you will receive your results only by: Marland Kitchen MyChart Message (if you have MyChart) OR . A paper copy in the mail If you have any lab test that is abnormal or we need to change your treatment, we will call you to review the results.  Testing/Procedures: You had an EKG performed today  Follow-Up: At [facility], you and your health needs are our priority.  As part of our continuing mission to provide you with exceptional heart care, we have created designated Provider Care Teams.  These Care Teams include your primary Cardiologist (physician) and Advanced Practice Providers (APPs -  Physician Assistants and Nurse Practitioners) who all work together to provide you with the care you need, when you need it.  Your next appointment:   6 month(s)  The format for your next appointment:   In Person  Provider:   Jyl Heinz, MD  Other Instructions Rosuvastatin Tablets What is this medicine? ROSUVASTATIN (roe SOO va sta tin) is known as a HMG-CoA reductase inhibitor or 'statin'. It lowers cholesterol and triglycerides in the blood. This drug may also reduce the risk of heart attack, stroke, or other health problems in patients with risk factors for heart disease. Diet and lifestyle changes are often used with this drug. This medicine may be used for other purposes; ask your health care provider or pharmacist if you have questions. COMMON BRAND NAME(S): Crestor What should I tell my health care provider before I take this medicine? They need to know if you  have any of these conditions:  diabetes  if you often drink alcohol  history of stroke  kidney disease  liver disease  muscle aches or weakness  thyroid disease  an unusual or allergic reaction to rosuvastatin, other medicines, foods, dyes, or preservatives  pregnant or trying to get pregnant  breast-feeding How should I use this medicine? Take this medicine by mouth with a glass of water. Follow the directions on the prescription label. Do not cut, crush or chew this medicine. You can take this medicine with or without food. Take your doses at regular intervals. Do not take your medicine more often than directed. Talk to your pediatrician regarding the use of this medicine in children. While this drug may be prescribed for children as young as 54 years old for selected conditions, precautions do apply. Overdosage: If you think you have taken too much of this medicine contact a poison control center or emergency room at once. NOTE: This medicine is only for you. Do not share this medicine with others. What if I miss a dose? If you miss a dose, take it as soon as you can. If your next dose is to be taken in less than 12 hours, then do not take the missed dose. Take the next dose at your regular time. Do not take double or extra doses. What may interact with this medicine? Do not take this medicine with any of the following medications:  herbal medicines like red yeast rice This medicine may also interact with the following medications:  alcohol  antacids containing aluminum hydroxide or magnesium hydroxide  cyclosporine  other medicines for high cholesterol  some medicines for HIV infection  warfarin This list may not describe all possible interactions. Give your health care provider a list of all the medicines, herbs, non-prescription drugs, or dietary supplements you use. Also tell them if you smoke, drink alcohol, or use illegal drugs. Some items may interact with your  medicine. What should I watch for while using this medicine? Visit your doctor or health care professional for regular check-ups. You may need regular tests to make sure your liver is working properly. Your health care professional may tell you to stop taking this medicine if you develop muscle problems. If your muscle problems do not go away after stopping this medicine, contact your health care professional. Do not become pregnant while taking this medicine. Women should inform their health care professional if they wish to become pregnant or think they might be pregnant. There is a potential for serious side effects to an unborn child. Talk to your health care professional or pharmacist for more information. Do not breast-feed an infant while taking this medicine. This medicine may increase blood sugar. Ask your healthcare provider if changes in diet or medicines are needed if you have diabetes. If you are going to need surgery or other procedure, tell your doctor that you are using this medicine. This drug is only part of a total heart-health program. Your doctor or a dietician can suggest a low-cholesterol and low-fat diet to help. Avoid alcohol and smoking, and keep a proper exercise schedule. This medicine may cause a decrease in Co-Enzyme Q-10. You should make sure that you get enough Co-Enzyme Q-10 while you are taking this medicine. Discuss the foods you eat and the vitamins you take with your health care professional. What side effects may I notice from receiving this medicine? Side effects that you should report to your doctor or health care professional as soon as possible:  allergic reactions like skin rash, itching or hives, swelling of the face, lips, or tongue  confusion  joint pain  loss of memory  redness, blistering, peeling or loosening of the skin, including inside the mouth  signs and symptoms of high blood sugar such as being more thirsty or hungry or having to urinate  more than normal. You may also feel very tired or have blurry vision.  signs and symptoms of muscle injury like dark urine; trouble passing urine or change in the amount of urine; unusually weak or tired; muscle pain or side or back pain  yellowing of the eyes or skin Side effects that usually do not require medical attention (report to your doctor or health care professional if they continue or are bothersome):  constipation  diarrhea  dizziness  gas  headache  nausea  stomach pain  trouble sleeping  upset stomach This list may not describe all possible side effects. Call your doctor for medical advice about side effects. You may report side effects to FDA at 1-800-FDA-1088. Where should I keep my medicine? Keep out of the reach of children. Store at room temperature between 20 and 25 degrees C (68 and 77 degrees F). Keep container tightly closed (protect from moisture). Throw away any unused medicine after the expiration date. NOTE: This sheet is a summary. It may not cover all possible information. If you have questions about this medicine, talk to your doctor, pharmacist, or health care provider.  2020 Elsevier/Gold Standard (2017-11-18 08:25:08)

## 2018-12-31 LAB — HEPATIC FUNCTION PANEL
ALT: 23 IU/L (ref 0–32)
AST: 23 IU/L (ref 0–40)
Albumin: 4.2 g/dL (ref 3.8–4.8)
Alkaline Phosphatase: 90 IU/L (ref 39–117)
Bilirubin Total: 0.7 mg/dL (ref 0.0–1.2)
Bilirubin, Direct: 0.18 mg/dL (ref 0.00–0.40)
Total Protein: 7.4 g/dL (ref 6.0–8.5)

## 2018-12-31 LAB — CBC
Hematocrit: 40.3 % (ref 34.0–46.6)
Hemoglobin: 13.5 g/dL (ref 11.1–15.9)
MCH: 29.8 pg (ref 26.6–33.0)
MCHC: 33.5 g/dL (ref 31.5–35.7)
MCV: 89 fL (ref 79–97)
Platelets: 214 10*3/uL (ref 150–450)
RBC: 4.53 x10E6/uL (ref 3.77–5.28)
RDW: 13.4 % (ref 11.7–15.4)
WBC: 7.3 10*3/uL (ref 3.4–10.8)

## 2018-12-31 LAB — LIPID PANEL
Chol/HDL Ratio: 2.7 ratio (ref 0.0–4.4)
Cholesterol, Total: 216 mg/dL — ABNORMAL HIGH (ref 100–199)
HDL: 80 mg/dL (ref >39)
LDL Chol Calc (NIH): 115 mg/dL — ABNORMAL HIGH (ref 0–99)
Triglycerides: 122 mg/dL (ref 0–149)
VLDL Cholesterol Cal: 21 mg/dL (ref 5–40)

## 2018-12-31 LAB — BASIC METABOLIC PANEL
BUN/Creatinine Ratio: 16 (ref 12–28)
BUN: 16 mg/dL (ref 8–27)
CO2: 26 mmol/L (ref 20–29)
Calcium: 9.9 mg/dL (ref 8.7–10.3)
Chloride: 103 mmol/L (ref 96–106)
Creatinine, Ser: 0.97 mg/dL (ref 0.57–1.00)
GFR calc Af Amer: 69 mL/min/{1.73_m2} (ref >59)
GFR calc non Af Amer: 60 mL/min/{1.73_m2} (ref >59)
Glucose: 129 mg/dL — ABNORMAL HIGH (ref 65–99)
Potassium: 3.7 mmol/L (ref 3.5–5.2)
Sodium: 143 mmol/L (ref 134–144)

## 2018-12-31 LAB — TSH: TSH: 0.66 u[IU]/mL (ref 0.450–4.500)

## 2019-01-03 ENCOUNTER — Telehealth: Payer: Self-pay

## 2019-01-03 DIAGNOSIS — E782 Mixed hyperlipidemia: Secondary | ICD-10-CM

## 2019-01-03 MED ORDER — ROSUVASTATIN CALCIUM 10 MG PO TABS: 10.0000 mg | ORAL_TABLET | Freq: Every day | ORAL | 3 refills | Status: DC

## 2019-01-03 NOTE — Telephone Encounter (Signed)
Results relayed, copy sent to Dr. Polite 

## 2019-01-03 NOTE — Addendum Note (Signed)
Addended by: Beckey Rutter on: 01/03/2019 04:35 PM   Modules accepted: Orders

## 2019-01-03 NOTE — Telephone Encounter (Signed)
-----   Message from Jenean Lindau, MD sent at 01/02/2019  8:14 AM EST ----- Double Crestor and liver lipid check in 6 weeks and diet.  Copy primary care Jenean Lindau, MD 01/02/2019 8:13 AM

## 2019-01-17 ENCOUNTER — Other Ambulatory Visit: Payer: Self-pay | Admitting: Cardiology

## 2019-01-18 ENCOUNTER — Other Ambulatory Visit: Payer: Self-pay | Admitting: *Deleted

## 2019-01-18 MED ORDER — ROSUVASTATIN CALCIUM 10 MG PO TABS: 10.0000 mg | ORAL_TABLET | Freq: Every day | ORAL | 1 refills | Status: DC

## 2019-02-15 LAB — LIPID PANEL
Chol/HDL Ratio: 1.8 ratio (ref 0.0–4.4)
Cholesterol, Total: 143 mg/dL (ref 100–199)
HDL: 79 mg/dL (ref >39)
LDL Chol Calc (NIH): 51 mg/dL (ref 0–99)
Triglycerides: 60 mg/dL (ref 0–149)
VLDL Cholesterol Cal: 13 mg/dL (ref 5–40)

## 2019-02-15 LAB — HEPATIC FUNCTION PANEL
ALT: 23 IU/L (ref 0–32)
AST: 24 IU/L (ref 0–40)
Albumin: 4.3 g/dL (ref 3.8–4.8)
Alkaline Phosphatase: 103 IU/L (ref 39–117)
Bilirubin Total: 0.5 mg/dL (ref 0.0–1.2)
Bilirubin, Direct: 0.18 mg/dL (ref 0.00–0.40)
Total Protein: 7.3 g/dL (ref 6.0–8.5)

## 2019-02-24 DIAGNOSIS — R7309 Other abnormal glucose: Secondary | ICD-10-CM | POA: Diagnosis not present

## 2019-02-24 DIAGNOSIS — E7800 Pure hypercholesterolemia, unspecified: Secondary | ICD-10-CM | POA: Diagnosis not present

## 2019-02-24 DIAGNOSIS — Z1389 Encounter for screening for other disorder: Secondary | ICD-10-CM | POA: Diagnosis not present

## 2019-02-24 DIAGNOSIS — I10 Essential (primary) hypertension: Secondary | ICD-10-CM | POA: Diagnosis not present

## 2019-02-24 DIAGNOSIS — I2510 Atherosclerotic heart disease of native coronary artery without angina pectoris: Secondary | ICD-10-CM | POA: Diagnosis not present

## 2019-02-24 DIAGNOSIS — Z0000 Encounter for general adult medical examination without abnormal findings: Secondary | ICD-10-CM | POA: Diagnosis not present

## 2019-02-24 DIAGNOSIS — D860 Sarcoidosis of lung: Secondary | ICD-10-CM | POA: Diagnosis not present

## 2019-03-06 ENCOUNTER — Telehealth: Payer: Self-pay | Admitting: Cardiology

## 2019-03-06 DIAGNOSIS — M791 Myalgia, unspecified site: Secondary | ICD-10-CM

## 2019-03-06 NOTE — Telephone Encounter (Signed)
Pt c/o medication issue:  1. Name of Medication: rosuvastatin (CRESTOR) 10 MG tablet  2. How are you currently taking this medication (dosage and times per day)? Take 1 tablet (10 mg total) by mouth daily  3. Are you having a reaction (difficulty breathing--STAT)? No  4. What is your medication issue?  Patient states she is experiencing extreme muscle aches.

## 2019-03-06 NOTE — Telephone Encounter (Signed)
Spoke with the patient who states that for about a week now she has been having severe muscle cramps in her back, hip, and legs. She is also having trouble sleeping and reports headaches. She states that she has continued to take Crestor but is about to stop due to the side effects. I advised her that I would reach out to Dr. Tomie China for recommendations.

## 2019-03-07 NOTE — Telephone Encounter (Signed)
Follow up    Pt is returning call and says she stopped taking the medication and her muscles are really hurting her  She is awaiting a call back    Please call back

## 2019-03-07 NOTE — Telephone Encounter (Signed)
Left message for patient to come by office in next few days for a BMP and CPK to be drawn. No appt necessary. Requested that patient call back to confirm receipt of call.

## 2019-03-07 NOTE — Telephone Encounter (Signed)
Please check with her if her muscles are still hurting.  If so please bring her in for a Chem-7 and a CPK.

## 2019-03-08 DIAGNOSIS — M791 Myalgia, unspecified site: Secondary | ICD-10-CM | POA: Diagnosis not present

## 2019-03-09 LAB — BASIC METABOLIC PANEL
BUN/Creatinine Ratio: 16 (ref 12–28)
BUN: 15 mg/dL (ref 8–27)
CO2: 24 mmol/L (ref 20–29)
Calcium: 10.3 mg/dL (ref 8.7–10.3)
Chloride: 102 mmol/L (ref 96–106)
Creatinine, Ser: 0.94 mg/dL (ref 0.57–1.00)
GFR calc Af Amer: 72 mL/min/{1.73_m2} (ref >59)
GFR calc non Af Amer: 63 mL/min/{1.73_m2} (ref >59)
Glucose: 121 mg/dL — ABNORMAL HIGH (ref 65–99)
Potassium: 3.7 mmol/L (ref 3.5–5.2)
Sodium: 141 mmol/L (ref 134–144)

## 2019-03-09 LAB — CK: Total CK: 218 U/L — ABNORMAL HIGH (ref 32–182)

## 2019-03-14 ENCOUNTER — Telehealth: Payer: Self-pay

## 2019-03-14 NOTE — Addendum Note (Signed)
Addended by: Pamala Hurry on: 03/14/2019 12:32 PM   Modules accepted: Orders

## 2019-03-14 NOTE — Telephone Encounter (Signed)
-----   Message from Garwin Brothers, MD sent at 03/10/2019  3:59 PM EST ----- Stop statin therapy.  Send copy to primary care and have patient be evaluated by primary care doctor and inform patient. Garwin Brothers, MD 03/10/2019 3:59 PM

## 2019-03-14 NOTE — Telephone Encounter (Signed)
Results relayed, copy sent to Dr. Nehemiah Settle

## 2019-03-20 DIAGNOSIS — M79601 Pain in right arm: Secondary | ICD-10-CM | POA: Diagnosis not present

## 2019-04-17 DIAGNOSIS — Z1159 Encounter for screening for other viral diseases: Secondary | ICD-10-CM | POA: Diagnosis not present

## 2019-04-20 DIAGNOSIS — Z1211 Encounter for screening for malignant neoplasm of colon: Secondary | ICD-10-CM | POA: Diagnosis not present

## 2019-04-20 DIAGNOSIS — K5730 Diverticulosis of large intestine without perforation or abscess without bleeding: Secondary | ICD-10-CM | POA: Diagnosis not present

## 2019-08-09 ENCOUNTER — Ambulatory Visit: Payer: Medicare Other | Admitting: Cardiology

## 2019-10-18 IMAGING — CT CT CHEST HIGH RESOLUTION W/O CM
2 of 5 series · 14 of 36 positions shown, 17 images · non-contrast
Comparison: No priors.

CLINICAL DATA: 67-year-old female with history of sarcoidosis. One
week history of chest congestion. Productive cough with clear
sputum. Increasing shortness of breath.

EXAM:
CT CHEST WITHOUT CONTRAST
TECHNIQUE: Multidetector CT imaging of the chest was performed following the
standard protocol without intravenous contrast. High resolution
imaging of the lungs, as well as inspiratory and expiratory imaging,
was performed.

[Series 2: high resolution · axial · 0.66mm/px · z∈[-281,-41]mm · 11 of 133 slices shown, 14 images]
[im 7/133  mediastinal]
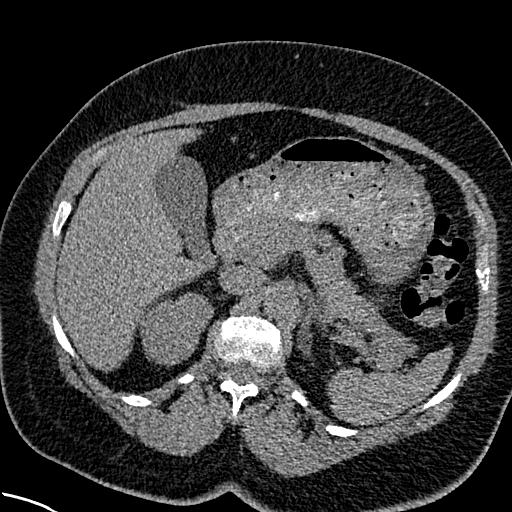
[im 7/133  lung]
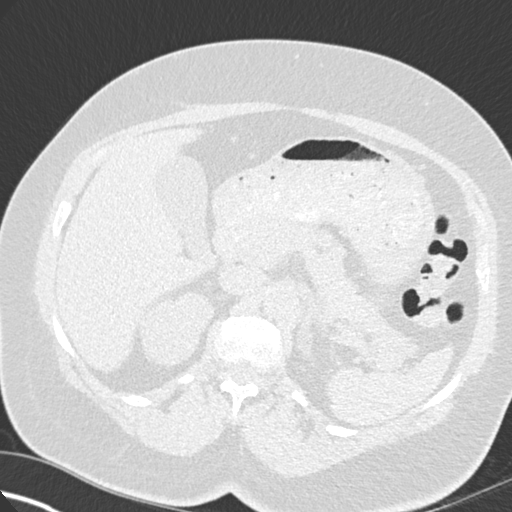
[im 19/133  lung]
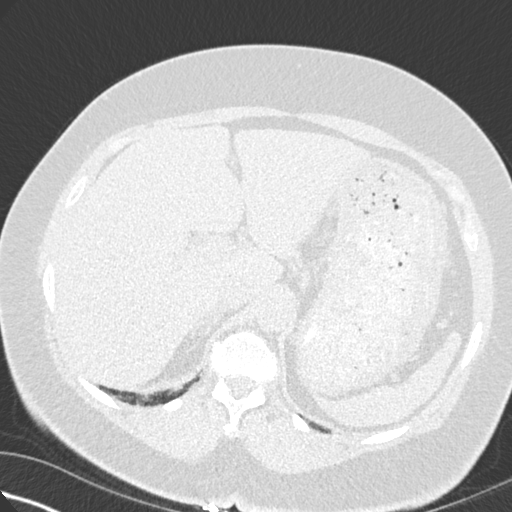
[im 31/133  lung]
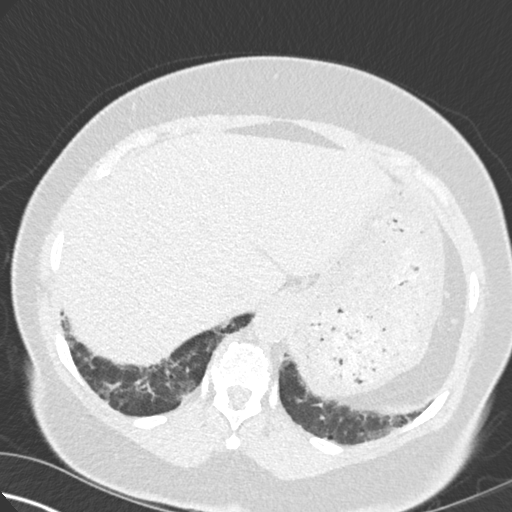
[im 43/133  lung]
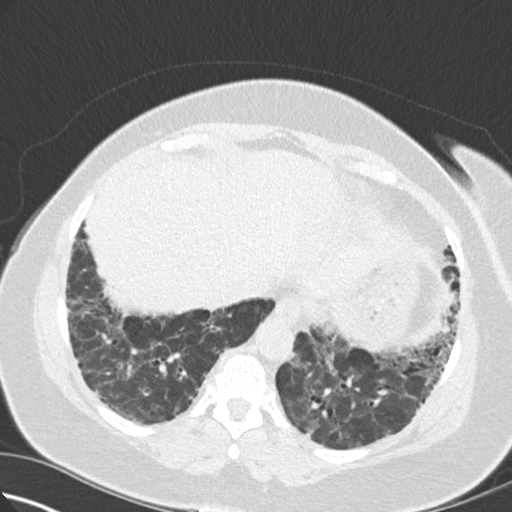
[im 55/133  mediastinal]
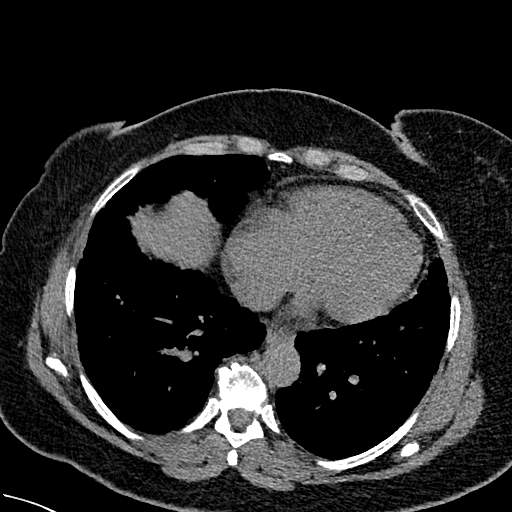
[im 55/133  lung]
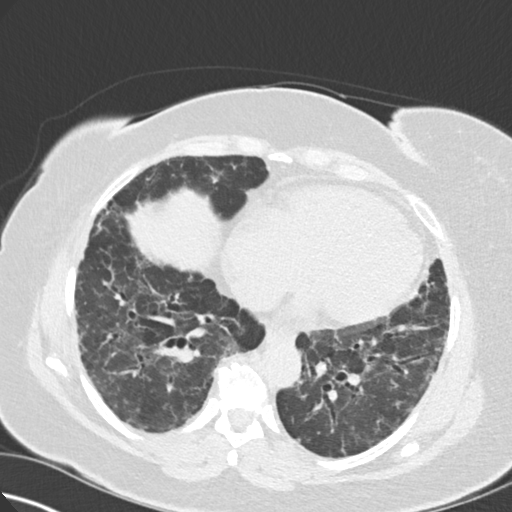
[im 67/133  lung]
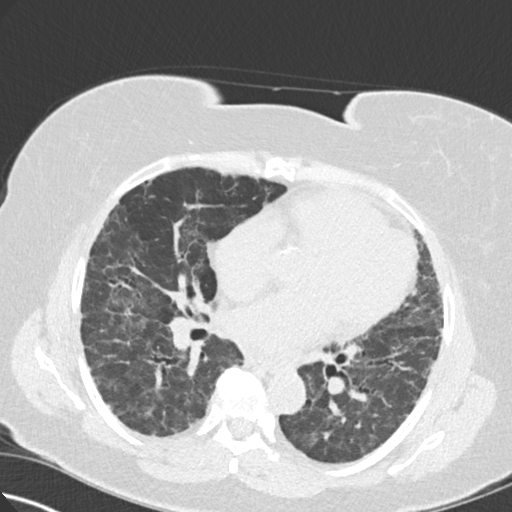
[im 79/133  lung]
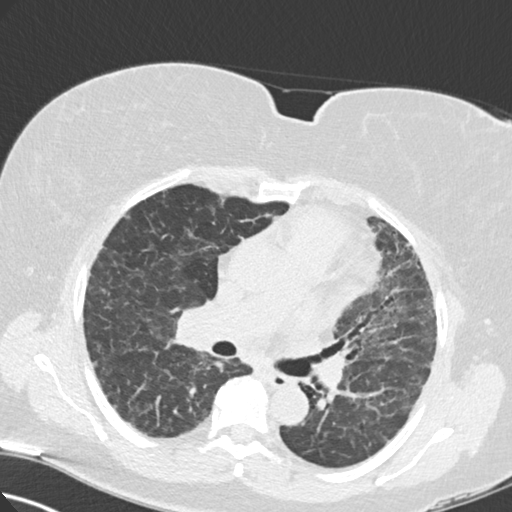
[im 91/133  lung]
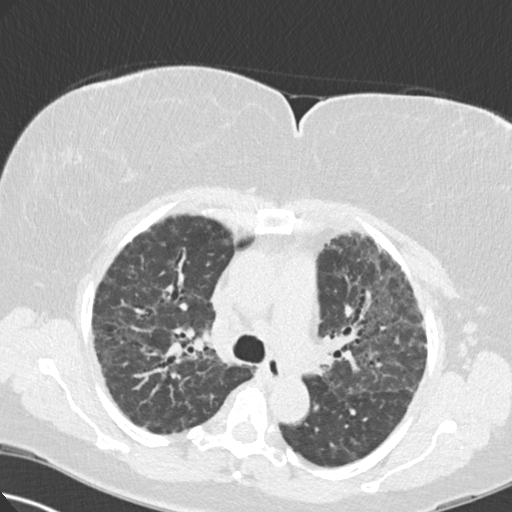
[im 103/133  mediastinal]
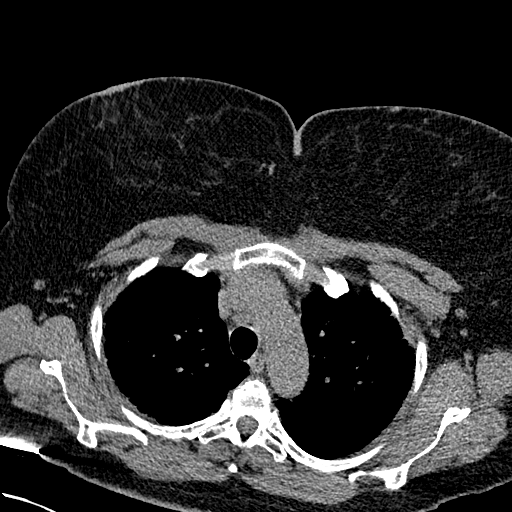
[im 103/133  lung]
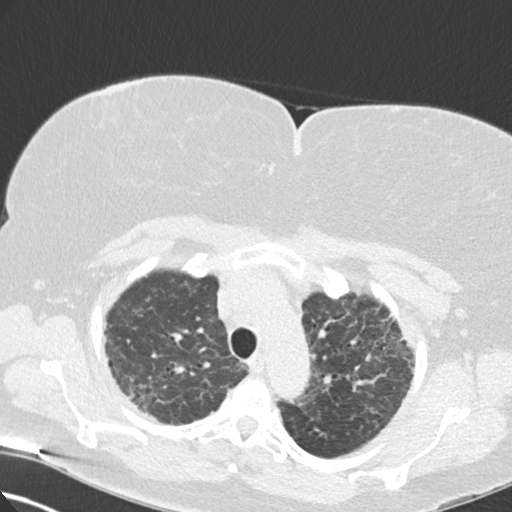
[im 115/133  lung]
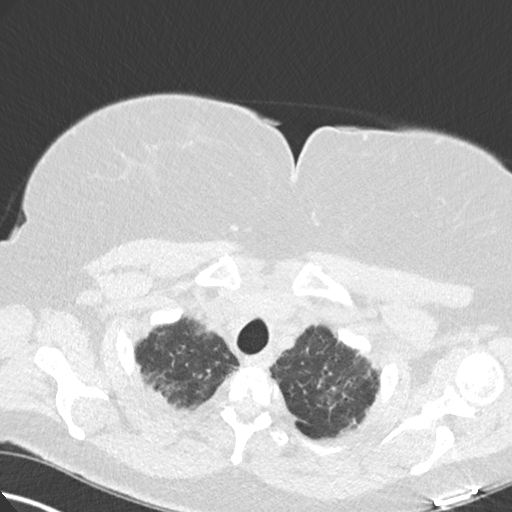
[im 127/133  lung]
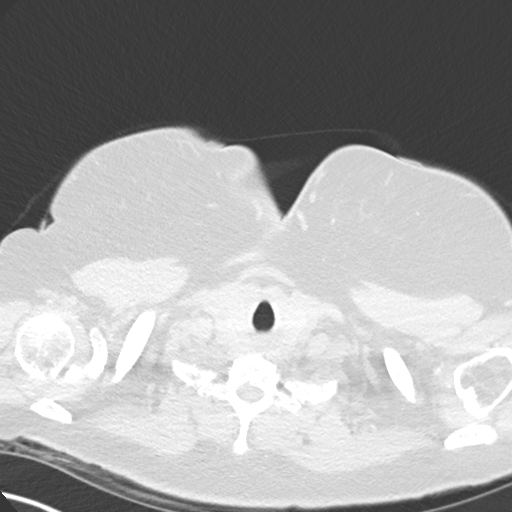

[Series 9: coronal · coronal · 0.55mm/px · 3 of 120 slices shown]
[im 24/120  lung]
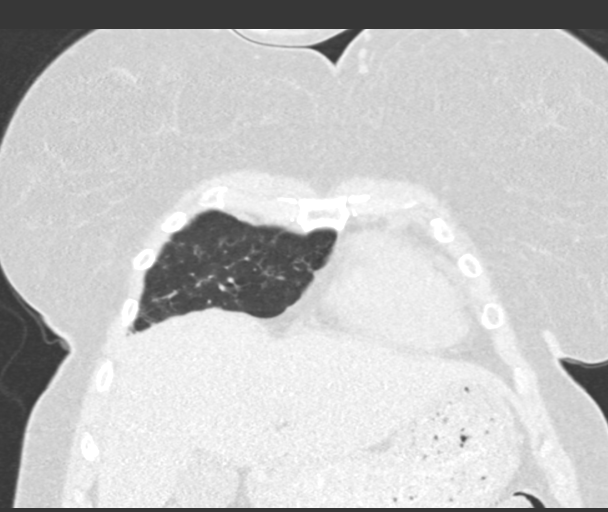
[im 48/120  lung]
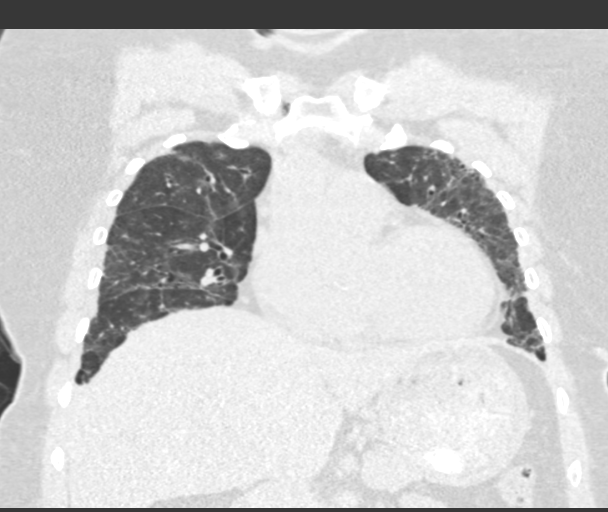
[im 72/120  lung]
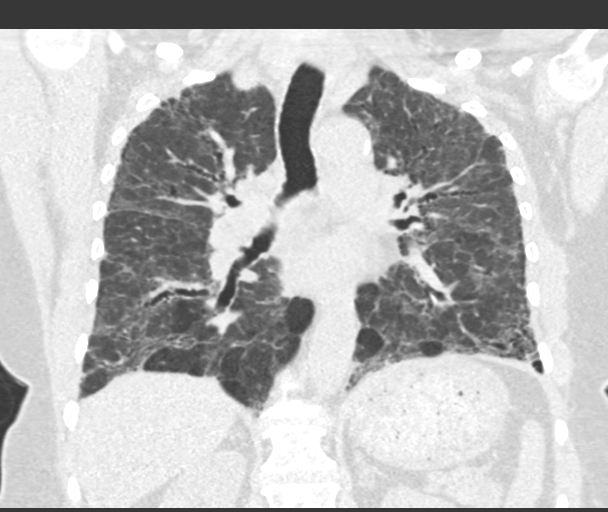

[14 of 36 positions shown; findings below may reference images not displayed]

FINDINGS: Cardiovascular: Heart size is mildly enlarged. There is no
significant pericardial fluid, thickening or pericardial
calcification. There is aortic atherosclerosis, as well as
atherosclerosis of the great vessels of the mediastinum and the
coronary arteries, including calcified atherosclerotic plaque in the
left anterior descending coronary artery.

Mediastinum/Nodes: No pathologically enlarged mediastinal or hilar
lymph nodes. Please note that accurate exclusion of hilar adenopathy
is limited on noncontrast CT scans. Esophagus is unremarkable in
appearance. No axillary lymphadenopathy.

Lungs/Pleura: High-resolution images demonstrate widespread areas of
ground-glass attenuation, septal thickening, thickening of the
peribronchovascular interstitium, cylindrical bronchiectasis,
peripheral bronchiolectasis, and a few areas suggestive of
developing honeycombing (not yet definitive). Findings have no
definitive craniocaudal gradient. Inspiratory and expiratory imaging
demonstrates mild air trapping, indicative of mild to moderate small
airways disease. No acute consolidative airspace disease. No pleural
effusions. No suspicious appearing pulmonary nodules or masses are
noted.

Upper Abdomen: Aortic atherosclerosis.

Musculoskeletal: There are no aggressive appearing lytic or blastic
lesions noted in the visualized portions of the skeleton.
IMPRESSION: 1. Widespread pulmonary fibrosis without definitive craniocaudal
gradient, as detailed above. Based on the findings on today's
examination, this is considered ATS indeterminate for usual
interstitial pneumonia (UIP). At this time, findings are favored to
represent chronic hypersensitivity pneumonitis, although UIP is not
entirely excluded. Repeat high-resolution chest CT is recommended in
12 months to assess for temporal changes in the appearance of the
lung parenchyma.
2. Aortic atherosclerosis, in addition to left anterior descending
coronary artery disease. Please note that although the presence of
coronary artery calcium documents the presence of coronary artery
disease, the severity of this disease and any potential stenosis
cannot be assessed on this non-gated CT examination. Assessment for
potential risk factor modification, dietary therapy or pharmacologic
therapy may be warranted, if clinically indicated.

Aortic Atherosclerosis ([id number]).

## 2019-10-24 DIAGNOSIS — Z23 Encounter for immunization: Secondary | ICD-10-CM | POA: Diagnosis not present

## 2020-01-15 DIAGNOSIS — Z6838 Body mass index (BMI) 38.0-38.9, adult: Secondary | ICD-10-CM | POA: Diagnosis not present

## 2020-01-15 DIAGNOSIS — Z1211 Encounter for screening for malignant neoplasm of colon: Secondary | ICD-10-CM | POA: Diagnosis not present

## 2020-01-15 DIAGNOSIS — D860 Sarcoidosis of lung: Secondary | ICD-10-CM | POA: Insufficient documentation

## 2020-01-15 DIAGNOSIS — Z1231 Encounter for screening mammogram for malignant neoplasm of breast: Secondary | ICD-10-CM | POA: Diagnosis not present

## 2020-01-15 DIAGNOSIS — Z01419 Encounter for gynecological examination (general) (routine) without abnormal findings: Secondary | ICD-10-CM | POA: Diagnosis not present

## 2020-01-16 ENCOUNTER — Encounter: Payer: Self-pay | Admitting: Cardiology

## 2020-01-16 ENCOUNTER — Other Ambulatory Visit: Payer: Self-pay

## 2020-01-16 ENCOUNTER — Ambulatory Visit (INDEPENDENT_AMBULATORY_CARE_PROVIDER_SITE_OTHER): Payer: Medicare Other | Admitting: Cardiology

## 2020-01-16 VITALS — BP 130/90 | HR 73 | Ht 62.0 in | Wt 200.1 lb

## 2020-01-16 DIAGNOSIS — E782 Mixed hyperlipidemia: Secondary | ICD-10-CM

## 2020-01-16 DIAGNOSIS — I2510 Atherosclerotic heart disease of native coronary artery without angina pectoris: Secondary | ICD-10-CM

## 2020-01-16 DIAGNOSIS — I10 Essential (primary) hypertension: Secondary | ICD-10-CM

## 2020-01-16 NOTE — Addendum Note (Signed)
Addended by: Eleonore Chiquito on: 01/16/2020 10:42 AM   Modules accepted: Orders

## 2020-01-16 NOTE — Progress Notes (Signed)
Cardiology Office Note:    Date:  01/16/2020   ID:  Terri Fitzgerald, DOB 1950-03-15, MRN [id number]  PCP:  Renford Dills, MD  Cardiologist:  Garwin Brothers, MD   Referring MD: Renford Dills, MD    ASSESSMENT:    1. Atherosclerosis of native coronary artery of native heart without angina pectoris   2. Primary hypertension   3. Mixed hyperlipidemia    PLAN:    In order of problems listed above:  1. Coronary atherosclerosis: Secondary prevention stressed with the patient.  Importance of compliance with diet medication stressed and she vocalized understanding.  She tells me that she walks about half an hour at times without any symptoms.  Only when she gets up.  She is out of breath.  I told her to walk at least half an hour a day every day and she promises to do so. 2. Essential hypertension: Blood pressure stable and diet was emphasized she told me about her blood pressure readings and they are fine. 3. Mixed dyslipidemia: I will do lipid check today.  Last time her LDL was 51 and therefore I did not initiate her on any lipid-lowering medications. 4. Obesity: Weight reduction was stressed diet was emphasized.  Importance of regular exercise stressed.  I told her to lose weight risks of obesity explained and she promises to do better. 5. Patient will be seen in follow-up appointment in 6 months or earlier if the patient has any concerns    Medication Adjustments/Labs and Tests Ordered: Current medicines are reviewed at length with the patient today.  Concerns regarding medicines are outlined above.  No orders of the defined types were placed in this encounter.  No orders of the defined types were placed in this encounter.    No chief complaint on file.    History of Present Illness:    Terri Fitzgerald is a 69 y.o. female.  Patient has past medical history of coronary atherosclerosis essential hypertension dyslipidemia obesity.  She denies any problems at this time  and takes care of activities of daily living.  No chest pain orthopnea or PND.  She has gained weight.  She leads a sedentary lifestyle.  At the time of my evaluation, the patient is alert awake oriented and in no distress.  Past Medical History:  Diagnosis Date  . Bilateral leg edema 10/25/2015  . Coronary atherosclerosis 01/11/2018  . Hyperlipidemia 11/22/2015  . Hypertension   . Need for prophylactic vaccination and inoculation against influenza 10/25/2015  . Obesity   . Pain in limb 10/25/2015  . Sarcoidosis 11/21/2012  . Sarcoidosis of lung (HCC)    uses Adavir and Albuterol for reactive airway but sarcoid reportedly not active; sees pulmology  . Seasonal allergies 10/04/2017    Past Surgical History:  Procedure Laterality Date  . TUBAL LIGATION      Current Medications: Current Meds  Medication Sig  . Albuterol Sulfate (PROAIR RESPICLICK) 108 (90 Base) MCG/ACT AEPB Inhale 2 puffs into the lungs 4 (four) times daily as needed.  Marland Kitchen amLODipine (NORVASC) 5 MG tablet Take 1 tablet (5 mg total) by mouth daily.  . calcium carbonate (OSCAL) 1500 (600 Ca) MG TABS tablet Take by mouth 2 (two) times daily with a meal.  . Fluticasone-Salmeterol (ADVAIR DISKUS) 500-50 MCG/DOSE AEPB Inhale 1 puff into the lungs 2 (two) times daily.  . hydrocortisone 2.5 % cream APPLY TO AFFECTED AREA TWICE A DAY AS NEEDED  . valsartan-hydrochlorothiazide (DIOVAN-HCT) 320-25 MG tablet Take 1  tablet by mouth daily.  . vitamin E 100 UNIT capsule Take by mouth daily.     Allergies:   Patient has no known allergies.   Social History   Socioeconomic History  . Marital status: Widowed    Spouse name: Not on file  . Number of children: Not on file  . Years of education: Not on file  . Highest education level: Not on file  Occupational History  . Not on file  Tobacco Use  . Smoking status: Never Smoker  . Smokeless tobacco: Never Used  Substance and Sexual Activity  . Alcohol use: Yes    Comment: Rare  .  Drug use: No  . Sexual activity: Not Currently  Other Topics Concern  . Not on file  Social History Narrative  . Not on file   Social Determinants of Health   Financial Resource Strain:   . Difficulty of Paying Living Expenses: Not on file  Food Insecurity:   . Worried About Programme researcher, broadcasting/film/video in the Last Year: Not on file  . Ran Out of Food in the Last Year: Not on file  Transportation Needs:   . Lack of Transportation (Medical): Not on file  . Lack of Transportation (Non-Medical): Not on file  Physical Activity:   . Days of Exercise per Week: Not on file  . Minutes of Exercise per Session: Not on file  Stress:   . Feeling of Stress : Not on file  Social Connections:   . Frequency of Communication with Friends and Family: Not on file  . Frequency of Social Gatherings with Friends and Family: Not on file  . Attends Religious Services: Not on file  . Active Member of Clubs or Organizations: Not on file  . Attends Banker Meetings: Not on file  . Marital Status: Not on file     Family History: The patient's family history includes Asthma in her maternal grandmother; Prostate cancer in her brother.  ROS:   Please see the history of present illness.    All other systems reviewed and are negative.  EKGs/Labs/Other Studies Reviewed:    The following studies were reviewed today: EKG reveals sinus rhythm and nonspecific ST-T changes.  Left posterior fascicular block. I discussed my findings with the patient at length   Recent Labs: 02/14/2019: ALT 23 03/08/2019: BUN 15; Creatinine, Ser 0.94; Potassium 3.7; Sodium 141  Recent Lipid Panel    Component Value Date/Time   CHOL 143 02/14/2019 1100   TRIG 60 02/14/2019 1100   HDL 79 02/14/2019 1100   CHOLHDL 1.8 02/14/2019 1100   CHOLHDL 2.7 11/22/2015 0956   VLDL 19 11/22/2015 0956   LDLCALC 51 02/14/2019 1100    Physical Exam:    VS:  BP 130/90   Pulse 73   Ht 5\' 2"  (1.575 m)   Wt 200 lb 1.3 oz (90.8 kg)    SpO2 95%   BMI 36.60 kg/m     Wt Readings from Last 3 Encounters:  01/16/20 200 lb 1.3 oz (90.8 kg)  12/30/18 199 lb 1.9 oz (90.3 kg)  06/20/18 205 lb (93 kg)     GEN: Patient is in no acute distress HEENT: Normal NECK: No JVD; No carotid bruits LYMPHATICS: No lymphadenopathy CARDIAC: Hear sounds regular, 2/6 systolic murmur at the apex. RESPIRATORY:  Clear to auscultation without rales, wheezing or rhonchi  ABDOMEN: Soft, non-tender, non-distended MUSCULOSKELETAL:  No edema; No deformity  SKIN: Warm and dry NEUROLOGIC:  Alert and oriented  x 3 PSYCHIATRIC:  Normal affect   Signed, Garwin Brothers, MD  01/16/2020 10:29 AM    [facility]

## 2020-01-16 NOTE — Patient Instructions (Signed)

## 2020-01-17 LAB — CBC WITH DIFFERENTIAL/PLATELET
Basophils Absolute: 0 10*3/uL (ref 0.0–0.2)
Basos: 0 %
EOS (ABSOLUTE): 0.2 10*3/uL (ref 0.0–0.4)
Eos: 3 %
Hematocrit: 42 % (ref 34.0–46.6)
Hemoglobin: 14.7 g/dL (ref 11.1–15.9)
Immature Grans (Abs): 0 10*3/uL (ref 0.0–0.1)
Immature Granulocytes: 1 %
Lymphocytes Absolute: 2.9 10*3/uL (ref 0.7–3.1)
Lymphs: 45 %
MCH: 30.6 pg (ref 26.6–33.0)
MCHC: 35 g/dL (ref 31.5–35.7)
MCV: 88 fL (ref 79–97)
Monocytes Absolute: 0.6 10*3/uL (ref 0.1–0.9)
Monocytes: 9 %
Neutrophils Absolute: 2.7 10*3/uL (ref 1.4–7.0)
Neutrophils: 42 %
Platelets: 221 10*3/uL (ref 150–450)
RBC: 4.8 x10E6/uL (ref 3.77–5.28)
RDW: 13 % (ref 11.7–15.4)
WBC: 6.4 10*3/uL (ref 3.4–10.8)

## 2020-01-17 LAB — BASIC METABOLIC PANEL
BUN/Creatinine Ratio: 13 (ref 12–28)
BUN: 12 mg/dL (ref 8–27)
CO2: 26 mmol/L (ref 20–29)
Calcium: 10.4 mg/dL — ABNORMAL HIGH (ref 8.7–10.3)
Chloride: 100 mmol/L (ref 96–106)
Creatinine, Ser: 0.91 mg/dL (ref 0.57–1.00)
GFR calc Af Amer: 74 mL/min/{1.73_m2} (ref >59)
GFR calc non Af Amer: 65 mL/min/{1.73_m2} (ref >59)
Glucose: 111 mg/dL — ABNORMAL HIGH (ref 65–99)
Potassium: 3.8 mmol/L (ref 3.5–5.2)
Sodium: 139 mmol/L (ref 134–144)

## 2020-01-17 LAB — LIPID PANEL
Chol/HDL Ratio: 2.6 ratio (ref 0.0–4.4)
Cholesterol, Total: 240 mg/dL — ABNORMAL HIGH (ref 100–199)
HDL: 91 mg/dL (ref >39)
LDL Chol Calc (NIH): 133 mg/dL — ABNORMAL HIGH (ref 0–99)
Triglycerides: 96 mg/dL (ref 0–149)
VLDL Cholesterol Cal: 16 mg/dL (ref 5–40)

## 2020-01-17 LAB — HEPATIC FUNCTION PANEL
ALT: 22 IU/L (ref 0–32)
AST: 21 IU/L (ref 0–40)
Albumin: 4.6 g/dL (ref 3.8–4.8)
Alkaline Phosphatase: 104 IU/L (ref 44–121)
Bilirubin Total: 0.6 mg/dL (ref 0.0–1.2)
Bilirubin, Direct: 0.17 mg/dL (ref 0.00–0.40)
Total Protein: 7.9 g/dL (ref 6.0–8.5)

## 2020-01-17 LAB — TSH: TSH: 0.936 u[IU]/mL (ref 0.450–4.500)

## 2020-01-18 ENCOUNTER — Telehealth: Payer: Self-pay | Admitting: Cardiology

## 2020-01-18 MED ORDER — EZETIMIBE 10 MG PO TABS: 10.0000 mg | ORAL_TABLET | Freq: Every day | ORAL | 12 refills | Status: AC

## 2020-01-18 NOTE — Addendum Note (Signed)
Addended by: Eleonore Chiquito on: 01/18/2020 03:51 PM   Modules accepted: Orders

## 2020-01-18 NOTE — Telephone Encounter (Signed)
Results reviewed with pt as per Dr. Kem Parkinson note.  Pt verbalized understanding and had no additional question.

## 2020-01-18 NOTE — Telephone Encounter (Signed)
Patient returning call about results. Please call back.  

## 2020-02-14 DIAGNOSIS — E785 Hyperlipidemia, unspecified: Secondary | ICD-10-CM | POA: Diagnosis not present

## 2020-02-14 DIAGNOSIS — I2510 Atherosclerotic heart disease of native coronary artery without angina pectoris: Secondary | ICD-10-CM | POA: Diagnosis not present

## 2020-02-14 DIAGNOSIS — R0600 Dyspnea, unspecified: Secondary | ICD-10-CM | POA: Diagnosis not present

## 2020-02-14 DIAGNOSIS — I2584 Coronary atherosclerosis due to calcified coronary lesion: Secondary | ICD-10-CM | POA: Diagnosis not present

## 2020-02-29 DIAGNOSIS — I700 Atherosclerosis of aorta: Secondary | ICD-10-CM | POA: Diagnosis not present

## 2020-02-29 DIAGNOSIS — D860 Sarcoidosis of lung: Secondary | ICD-10-CM | POA: Diagnosis not present

## 2020-02-29 DIAGNOSIS — I10 Essential (primary) hypertension: Secondary | ICD-10-CM | POA: Diagnosis not present

## 2020-02-29 DIAGNOSIS — R7309 Other abnormal glucose: Secondary | ICD-10-CM | POA: Diagnosis not present

## 2020-02-29 DIAGNOSIS — Z0000 Encounter for general adult medical examination without abnormal findings: Secondary | ICD-10-CM | POA: Diagnosis not present

## 2020-02-29 DIAGNOSIS — I2510 Atherosclerotic heart disease of native coronary artery without angina pectoris: Secondary | ICD-10-CM | POA: Diagnosis not present

## 2020-02-29 DIAGNOSIS — E7800 Pure hypercholesterolemia, unspecified: Secondary | ICD-10-CM | POA: Diagnosis not present

## 2020-03-27 ENCOUNTER — Ambulatory Visit (INDEPENDENT_AMBULATORY_CARE_PROVIDER_SITE_OTHER): Payer: Self-pay | Admitting: Pulmonary Disease

## 2020-03-27 ENCOUNTER — Encounter: Payer: Self-pay | Admitting: Pulmonary Disease

## 2020-03-27 ENCOUNTER — Other Ambulatory Visit: Payer: Self-pay

## 2020-03-27 VITALS — BP 122/78 | HR 80 | Temp 97.2°F | Ht 62.0 in | Wt 197.4 lb

## 2020-03-27 DIAGNOSIS — D860 Sarcoidosis of lung: Secondary | ICD-10-CM

## 2020-03-27 DIAGNOSIS — D869 Sarcoidosis, unspecified: Secondary | ICD-10-CM

## 2020-03-27 NOTE — Patient Instructions (Signed)
Glad you are doing well with your breathing We will get high-resolution CT at next available and PFTs in 3 months Follow-up in clinic after PFTs.

## 2020-03-27 NOTE — Progress Notes (Signed)
Terri Fitzgerald    [id number]    1950-12-13  Primary Care Physician:Polite, Windy Fast, MD  Referring Physician: Renford Dills, MD [address] [place],  [place] [address]  Chief complaint:  Follow up for sarcoidosis  HPI: Terri Fitzgerald is a 70 year old with diagnosis of pulmonary sarcoidosis. This was diagnosed by transbronchial biopsy in [place] around 1990. She has been followed by a pulmonologist there and has been on and off prednisone. She is stopped taking the prednisone in 2011 due to an improvement in her symptoms. She's asked her primary care to refer her to a pulmonary specialist for routine follow-up. She does not recall the name of the pulmonologist at [facility].  She has been prescribed Advair and albuterol. But she does not use this as she is asymptomatic. She does not have any extrapulmonary manifestations of sarcoid.   Pets: No pets Occupation: Retired Public house manager Exposures: Has some mold at home.  No hot tub, Jacuzzi or any other exposures. Smoking history: Never smoker Travel history: Originally from [place].  No significant recent travel.  Interim history: Seen back in clinic after being lost to follow-up since 2019 States that her breathing is doing well with no issues.  Not on any inhalers.  Outpatient Encounter Medications as of 03/27/2020  Medication Sig  . amLODipine (NORVASC) 5 MG tablet Take 1 tablet (5 mg total) by mouth daily.  . calcium carbonate (OSCAL) 1500 (600 Ca) MG TABS tablet Take by mouth 2 (two) times daily with a meal.  . ezetimibe (ZETIA) 10 MG tablet Take 1 tablet (10 mg total) by mouth daily.  . hydrocortisone 2.5 % cream APPLY TO AFFECTED AREA TWICE A DAY AS NEEDED  . valsartan-hydrochlorothiazide (DIOVAN-HCT) 320-25 MG tablet Take 1 tablet by mouth daily.  . [DISCONTINUED] Albuterol Sulfate (PROAIR RESPICLICK) 108 (90 Base) MCG/ACT AEPB Inhale 2 puffs into the lungs 4 (four) times daily as  needed.  . [DISCONTINUED] Fluticasone-Salmeterol (ADVAIR DISKUS) 500-50 MCG/DOSE AEPB Inhale 1 puff into the lungs 2 (two) times daily.  . [DISCONTINUED] vitamin E 100 UNIT capsule Take by mouth daily.   No facility-administered encounter medications on file as of 03/27/2020.   Physical Exam: Blood pressure 122/78, pulse 80, temperature (!) 97.2 F (36.2 C), temperature source Temporal, height 5\' 2"  (1.575 m), weight 197 lb 6.4 oz (89.5 kg), SpO2 93 %. Gen:      No acute distress HEENT:  EOMI, sclera anicteric Neck:     No masses; no thyromegaly Lungs:    Clear to auscultation bilaterally; normal respiratory effort CV:         Regular rate and rhythm; no murmurs Abd:      + bowel sounds; soft, non-tender; no palpable masses, no distension Ext:    No edema; adequate peripheral perfusion Skin:      Warm and dry; no rash Neuro: alert and oriented x 3 Psych: normal mood and affect  Data Reviewed: Imaging CT high-resolution 10/19/2017-pulmonary fibrosis with no basal gradient.  No honeycombing.  Aortic atherosclerosis with left main disease. I have reviewed the images personally.  PFTs 06/10/15 FVC 2.08 (90%), FEV1 1.90 [106%], F/F 91, TLC 73%, DLCO 60%. Minimal restriction, moderate diffusion impairment the corrects for alveolar volume.  Labs ACE levels 11/19/14 32 (normal) ACE levels 10/04/17 23 (normal)  Hypersensitivity panel 9/80/19-negative ANA, CCP, rheumatoid factor 9/80/19-negative  EKG 11/19/14- NSR, no acute changes D dimer 10/07/15- 0.24 LE dopplers 10/07/15-  No DVT.  Assessment:  Pulmonary sarcoidosis. Diagnosis by transbronchial biopsy in 1990. She had previously been on prednisone but has been off it for the past 5 years.   CT shows pulmonary fibrosis.  This could be HP but her work-up was normal and she does not have significant exposures except for minimal mold exposure We will continue to monitor this.  Follow-up with repeat PFTs  She is asymptomatic and does not  need any treatment at present. She has kept up with annual ophthalmology follow-up Recent LFTs in December 2021 are normal  Get high-resolution CT and PFTs for follow-up since she has not been reassessed since 2019  Aortic, coronary atherosclerosis Follows with cardiology at [facility]          Plan/Recommendations: - High-res CT, PFTs  Chilton Greathouse MD [facility] 03/27/2020, 10:56 AM  CC: Renford Dills, MD

## 2020-03-27 NOTE — Addendum Note (Signed)
Addended by: Jacquiline Doe on: 03/27/2020 11:12 AM   Modules accepted: Orders

## 2020-04-06 ENCOUNTER — Other Ambulatory Visit: Payer: Self-pay

## 2020-04-06 ENCOUNTER — Ambulatory Visit
Admission: RE | Admit: 2020-04-06 | Discharge: 2020-04-06 | Disposition: A | Discharge status: Home / Self Care | Payer: Medicare Other | Source: Ambulatory Visit | Attending: Pulmonary Disease | Admitting: Pulmonary Disease

## 2020-04-06 DIAGNOSIS — D869 Sarcoidosis, unspecified: Secondary | ICD-10-CM | POA: Diagnosis not present

## 2020-04-06 DIAGNOSIS — J479 Bronchiectasis, uncomplicated: Secondary | ICD-10-CM | POA: Diagnosis not present

## 2020-04-06 DIAGNOSIS — I708 Atherosclerosis of other arteries: Secondary | ICD-10-CM | POA: Diagnosis not present

## 2020-04-06 DIAGNOSIS — I2510 Atherosclerotic heart disease of native coronary artery without angina pectoris: Secondary | ICD-10-CM | POA: Diagnosis not present

## 2020-04-10 DIAGNOSIS — I2584 Coronary atherosclerosis due to calcified coronary lesion: Secondary | ICD-10-CM | POA: Diagnosis not present

## 2020-04-10 DIAGNOSIS — E785 Hyperlipidemia, unspecified: Secondary | ICD-10-CM | POA: Diagnosis not present

## 2020-04-10 DIAGNOSIS — I2510 Atherosclerotic heart disease of native coronary artery without angina pectoris: Secondary | ICD-10-CM | POA: Diagnosis not present

## 2020-07-17 ENCOUNTER — Other Ambulatory Visit: Payer: Self-pay | Admitting: Physician Assistant

## 2020-07-17 ENCOUNTER — Ambulatory Visit
Admission: RE | Admit: 2020-07-17 | Discharge: 2020-07-17 | Disposition: A | Discharge status: Home / Self Care | Payer: Medicare Other | Source: Ambulatory Visit | Attending: Physician Assistant | Admitting: Physician Assistant

## 2020-07-17 DIAGNOSIS — M25461 Effusion, right knee: Secondary | ICD-10-CM

## 2020-07-17 DIAGNOSIS — M1711 Unilateral primary osteoarthritis, right knee: Secondary | ICD-10-CM | POA: Diagnosis not present

## 2020-07-25 DIAGNOSIS — M1711 Unilateral primary osteoarthritis, right knee: Secondary | ICD-10-CM | POA: Diagnosis not present

## 2020-07-25 DIAGNOSIS — M25561 Pain in right knee: Secondary | ICD-10-CM | POA: Diagnosis not present

## 2020-07-30 DIAGNOSIS — M25561 Pain in right knee: Secondary | ICD-10-CM | POA: Diagnosis not present

## 2020-08-06 DIAGNOSIS — S83241A Other tear of medial meniscus, current injury, right knee, initial encounter: Secondary | ICD-10-CM | POA: Diagnosis not present

## 2020-08-06 DIAGNOSIS — M1711 Unilateral primary osteoarthritis, right knee: Secondary | ICD-10-CM | POA: Diagnosis not present

## 2020-08-14 DIAGNOSIS — R0600 Dyspnea, unspecified: Secondary | ICD-10-CM | POA: Diagnosis not present

## 2020-08-14 DIAGNOSIS — I10 Essential (primary) hypertension: Secondary | ICD-10-CM | POA: Diagnosis not present

## 2020-08-14 DIAGNOSIS — I2584 Coronary atherosclerosis due to calcified coronary lesion: Secondary | ICD-10-CM | POA: Diagnosis not present

## 2020-08-14 DIAGNOSIS — I2510 Atherosclerotic heart disease of native coronary artery without angina pectoris: Secondary | ICD-10-CM | POA: Diagnosis not present

## 2020-08-14 DIAGNOSIS — E785 Hyperlipidemia, unspecified: Secondary | ICD-10-CM | POA: Diagnosis not present

## 2020-09-05 DIAGNOSIS — M1711 Unilateral primary osteoarthritis, right knee: Secondary | ICD-10-CM | POA: Diagnosis not present

## 2021-02-18 DIAGNOSIS — M1711 Unilateral primary osteoarthritis, right knee: Secondary | ICD-10-CM | POA: Diagnosis not present

## 2021-03-03 DIAGNOSIS — Z0000 Encounter for general adult medical examination without abnormal findings: Secondary | ICD-10-CM | POA: Diagnosis not present

## 2021-03-03 DIAGNOSIS — Z5181 Encounter for therapeutic drug level monitoring: Secondary | ICD-10-CM | POA: Diagnosis not present

## 2021-03-03 DIAGNOSIS — E7800 Pure hypercholesterolemia, unspecified: Secondary | ICD-10-CM | POA: Diagnosis not present

## 2021-03-03 DIAGNOSIS — I700 Atherosclerosis of aorta: Secondary | ICD-10-CM | POA: Diagnosis not present

## 2021-03-03 DIAGNOSIS — I10 Essential (primary) hypertension: Secondary | ICD-10-CM | POA: Diagnosis not present

## 2021-03-03 DIAGNOSIS — D860 Sarcoidosis of lung: Secondary | ICD-10-CM | POA: Diagnosis not present

## 2021-03-13 DIAGNOSIS — I2584 Coronary atherosclerosis due to calcified coronary lesion: Secondary | ICD-10-CM | POA: Diagnosis not present

## 2021-03-13 DIAGNOSIS — R0609 Other forms of dyspnea: Secondary | ICD-10-CM | POA: Diagnosis not present

## 2021-03-13 DIAGNOSIS — R9431 Abnormal electrocardiogram [ECG] [EKG]: Secondary | ICD-10-CM | POA: Diagnosis not present

## 2021-03-13 DIAGNOSIS — I10 Essential (primary) hypertension: Secondary | ICD-10-CM | POA: Diagnosis not present

## 2021-03-13 DIAGNOSIS — E785 Hyperlipidemia, unspecified: Secondary | ICD-10-CM | POA: Diagnosis not present

## 2021-03-13 DIAGNOSIS — I2510 Atherosclerotic heart disease of native coronary artery without angina pectoris: Secondary | ICD-10-CM | POA: Diagnosis not present

## 2021-04-10 DIAGNOSIS — I2510 Atherosclerotic heart disease of native coronary artery without angina pectoris: Secondary | ICD-10-CM | POA: Diagnosis not present

## 2021-04-10 DIAGNOSIS — I2584 Coronary atherosclerosis due to calcified coronary lesion: Secondary | ICD-10-CM | POA: Diagnosis not present

## 2021-04-10 DIAGNOSIS — E785 Hyperlipidemia, unspecified: Secondary | ICD-10-CM | POA: Diagnosis not present

## 2021-04-10 DIAGNOSIS — R0609 Other forms of dyspnea: Secondary | ICD-10-CM | POA: Diagnosis not present

## 2021-04-10 DIAGNOSIS — I10 Essential (primary) hypertension: Secondary | ICD-10-CM | POA: Diagnosis not present

## 2021-04-10 DIAGNOSIS — R9431 Abnormal electrocardiogram [ECG] [EKG]: Secondary | ICD-10-CM | POA: Diagnosis not present

## 2021-04-23 ENCOUNTER — Other Ambulatory Visit: Payer: Self-pay | Admitting: *Deleted

## 2021-04-23 ENCOUNTER — Ambulatory Visit: Payer: Medicare Other | Admitting: Pulmonary Disease

## 2021-04-23 ENCOUNTER — Telehealth: Payer: Self-pay | Admitting: Pulmonary Disease

## 2021-04-23 ENCOUNTER — Encounter: Payer: Self-pay | Admitting: Pulmonary Disease

## 2021-04-23 ENCOUNTER — Ambulatory Visit (INDEPENDENT_AMBULATORY_CARE_PROVIDER_SITE_OTHER): Payer: Medicare Other | Admitting: Pulmonary Disease

## 2021-04-23 ENCOUNTER — Other Ambulatory Visit: Payer: Self-pay

## 2021-04-23 VITALS — BP 116/64 | HR 64 | Ht 62.0 in | Wt 194.0 lb

## 2021-04-23 DIAGNOSIS — D860 Sarcoidosis of lung: Secondary | ICD-10-CM

## 2021-04-23 LAB — PULMONARY FUNCTION TEST
DL/VA % pred: 97 %
DL/VA: 4.08 ml/min/mmHg/L
DLCO cor % pred: 67 %
DLCO cor: 12.19 ml/min/mmHg
DLCO unc % pred: 67 %
DLCO unc: 12.19 ml/min/mmHg
FEF 25-75 Post: 2.92 L/sec
FEF 25-75 Pre: 2.64 L/sec
FEF2575-%Change-Post: 10 %
FEF2575-%Pred-Post: 188 %
FEF2575-%Pred-Pre: 169 %
FEV1-%Change-Post: 3 %
FEV1-%Pred-Post: 103 %
FEV1-%Pred-Pre: 100 %
FEV1-Post: 1.72 L
FEV1-Pre: 1.67 L
FEV1FVC-%Change-Post: 1 %
FEV1FVC-%Pred-Pre: 119 %
FEV6-%Change-Post: 1 %
FEV6-%Pred-Post: 89 %
FEV6-%Pred-Pre: 88 %
FEV6-Post: 1.84 L
FEV6-Pre: 1.81 L
FEV6FVC-%Pred-Post: 104 %
FEV6FVC-%Pred-Pre: 104 %
FVC-%Change-Post: 1 %
FVC-%Pred-Post: 85 %
FVC-%Pred-Pre: 84 %
FVC-Post: 1.84 L
FVC-Pre: 1.81 L
Post FEV1/FVC ratio: 93 %
Post FEV6/FVC ratio: 100 %
Pre FEV1/FVC ratio: 92 %
Pre FEV6/FVC Ratio: 100 %
RV % pred: 46 %
RV: 0.98 L
TLC % pred: 72 %
TLC: 3.45 L

## 2021-04-23 NOTE — Progress Notes (Signed)
Full PFT performed today. °

## 2021-04-23 NOTE — Progress Notes (Addendum)
? ?      ?Terri Fitzgerald    [id number]    March 27, 1950 ? ?Primary Care Physician:Polite, Windy Fast, MD ? ?Referring Physician: Renford Dills, MD ?[address] ?[address] ?[place],  [place] [address] ? ?Chief complaint:  Follow up for sarcoidosis ? ?HPI: ?[name] is a 71 year old with diagnosis of pulmonary sarcoidosis. This was diagnosed by transbronchial biopsy in [place] around 1990. She has been followed by a pulmonologist there and has been on and off prednisone. She is stopped taking the prednisone in 2011 due to an improvement in her symptoms. She's asked her primary care to refer her to a pulmonary specialist for routine follow-up. She does not recall the name of the pulmonologist at [facility]. ?  ?She has been prescribed Advair and albuterol. But she does not use this as she is asymptomatic. She does not have any extrapulmonary manifestations of sarcoid.  ? ?Pets: No pets ?Occupation: Retired Public house manager ?Exposures: Has some mold at home.  No hot tub, Jacuzzi or any other exposures. ?Smoking history: Never smoker ?Travel history: Originally from [place].  No significant recent travel. ? ?Interim history: ?Breathing is doing well with no issues.  Recently had a stress test from her cardiologist at [facility]. ? ?Outpatient Encounter Medications as of 04/23/2021  ?Medication Sig  ? amLODipine (NORVASC) 5 MG tablet Take 1 tablet (5 mg total) by mouth daily.  ? calcium carbonate (OSCAL) 1500 (600 Ca) MG TABS tablet Take by mouth 2 (two) times daily with a meal.  ? ezetimibe (ZETIA) 10 MG tablet Take 1 tablet (10 mg total) by mouth daily.  ? hydrocortisone 2.5 % cream APPLY TO AFFECTED AREA TWICE A DAY AS NEEDED  ? valsartan-hydrochlorothiazide (DIOVAN-HCT) 320-25 MG tablet Take 1 tablet by mouth daily.  ? ?No facility-administered encounter medications on file as of 04/23/2021.  ? ?Physical Exam: ?Blood pressure 116/64, pulse 64, height 5\' 2"  (1.575 m), weight 194 lb (88 kg), SpO2 97  %. ?Gen:      No acute distress ?HEENT:  EOMI, sclera anicteric ?Neck:     No masses; no thyromegaly ?Lungs:    Clear to auscultation bilaterally; normal respiratory effort ?CV:         Regular rate and rhythm; no murmurs ?Abd:      + bowel sounds; soft, non-tender; no palpable masses, no distension ?Ext:    No edema; adequate peripheral perfusion ?Skin:      Warm and dry; no rash ?Neuro: alert and oriented x 3 ?Psych: normal mood and affect  ? ?Data Reviewed: ?Imaging ?CT high-resolution 10/19/2017-pulmonary fibrosis with no basal gradient.  No honeycombing.  Aortic atherosclerosis with left main disease. ?CT high-resolution 04/06/2020-stable changes of pulmonary fibrosis ?I have reviewed the images personally. ? ?PFTs  ?06/10/15 ?FVC 2.08 (90%), FEV1 1.90 [106%], F/F 91, TLC 73%, DLCO 60%. ?Minimal restriction, moderate diffusion impairment the corrects for alveolar volume. ? ?04/23/2021 ?FVC 1.84 [85%], FEV1 1.72 [103%], F/F 93, TLC 3.45 [72%], DLCO 12.19 [67%] ?Minimal restriction, mild diffusion defect ? ?Labs ?ACE levels 11/19/14 32 (normal) ?ACE levels 10/04/17 23 (normal) ? ?Hypersensitivity panel 9/80/19-negative ?ANA, CCP, rheumatoid factor 9/80/19-negative ? ?EKG 11/19/14- NSR, no acute changes ?D dimer 10/07/15- 0.24 ?LE dopplers 10/07/15- No DVT. ? ?Assessment:  ?Pulmonary sarcoidosis. ?Diagnosis by transbronchial biopsy in 1990. She had previously been on prednisone but has been off it for the past 5 years.  ? ?CT shows pulmonary fibrosis.  This could be HP but her work-up  was normal and she does not have significant exposures except for minimal mold exposure ?We will continue to monitor this.   ?She is asymptomatic and does not need any treatment at present. ?Has kept up with annual ophthalmology follow-up ?She had recent labs at her primary care office and will obtain this for review ? ?Aortic, coronary atherosclerosis ?Follows with cardiology at [facility] ?         ?Plan/Recommendations: ?- Follow-up in 1 year  with high-resolution CT ? ?Chilton Greathouse MD ?[facility] ?04/23/2021, 11:32 AM ? ?CC: Renford Dills, MD ? ? ?

## 2021-04-23 NOTE — Telephone Encounter (Signed)
Called made to patient. She stated that she did not call in reference to a cough. She had an appt with Dr. Isaiah Serge today. This message appears to had been created in error.  ?Message has been sent to [facility] to make aware via teams.  ? ?

## 2021-04-23 NOTE — Patient Instructions (Signed)
Full PFT performed today. °

## 2021-04-23 NOTE — Patient Instructions (Signed)
Glad you are doing well with regard to your breathing ?Your lung function tests are stable ?We will get your recent labs from Dr. Nehemiah Settle for review. ?We will get high-res CT and follow-up in 1 year. ?

## 2021-04-24 DIAGNOSIS — I059 Rheumatic mitral valve disease, unspecified: Secondary | ICD-10-CM | POA: Diagnosis not present

## 2021-05-30 ENCOUNTER — Other Ambulatory Visit: Payer: Self-pay | Admitting: Physician Assistant

## 2021-05-30 ENCOUNTER — Ambulatory Visit
Admission: RE | Admit: 2021-05-30 | Discharge: 2021-05-30 | Disposition: A | Discharge status: Home / Self Care | Payer: Medicare Other | Source: Ambulatory Visit | Attending: Physician Assistant | Admitting: Physician Assistant

## 2021-05-30 DIAGNOSIS — R0602 Shortness of breath: Secondary | ICD-10-CM

## 2021-05-30 DIAGNOSIS — R000 Tachycardia, unspecified: Secondary | ICD-10-CM | POA: Diagnosis not present

## 2021-05-30 DIAGNOSIS — R051 Acute cough: Secondary | ICD-10-CM | POA: Diagnosis not present

## 2021-05-30 DIAGNOSIS — R0989 Other specified symptoms and signs involving the circulatory and respiratory systems: Secondary | ICD-10-CM | POA: Diagnosis not present

## 2021-05-30 DIAGNOSIS — R059 Cough, unspecified: Secondary | ICD-10-CM | POA: Diagnosis not present

## 2021-06-20 ENCOUNTER — Ambulatory Visit
Admission: RE | Admit: 2021-06-20 | Discharge: 2021-06-20 | Disposition: A | Discharge status: Home / Self Care | Payer: Medicare Other | Source: Ambulatory Visit | Attending: Physician Assistant | Admitting: Physician Assistant

## 2021-06-20 ENCOUNTER — Other Ambulatory Visit: Payer: Self-pay | Admitting: Physician Assistant

## 2021-06-20 DIAGNOSIS — D72829 Elevated white blood cell count, unspecified: Secondary | ICD-10-CM | POA: Diagnosis not present

## 2021-06-20 DIAGNOSIS — R059 Cough, unspecified: Secondary | ICD-10-CM | POA: Diagnosis not present

## 2021-06-20 DIAGNOSIS — J189 Pneumonia, unspecified organism: Secondary | ICD-10-CM

## 2021-06-20 DIAGNOSIS — R0602 Shortness of breath: Secondary | ICD-10-CM | POA: Diagnosis not present

## 2021-06-22 DIAGNOSIS — S80812A Abrasion, left lower leg, initial encounter: Secondary | ICD-10-CM | POA: Diagnosis not present

## 2021-10-27 DIAGNOSIS — I2584 Coronary atherosclerosis due to calcified coronary lesion: Secondary | ICD-10-CM | POA: Diagnosis not present

## 2021-10-27 DIAGNOSIS — I10 Essential (primary) hypertension: Secondary | ICD-10-CM | POA: Diagnosis not present

## 2021-10-27 DIAGNOSIS — I2510 Atherosclerotic heart disease of native coronary artery without angina pectoris: Secondary | ICD-10-CM | POA: Diagnosis not present

## 2021-10-27 DIAGNOSIS — R9431 Abnormal electrocardiogram [ECG] [EKG]: Secondary | ICD-10-CM | POA: Diagnosis not present

## 2021-10-27 DIAGNOSIS — E785 Hyperlipidemia, unspecified: Secondary | ICD-10-CM | POA: Diagnosis not present

## 2021-10-27 DIAGNOSIS — R0609 Other forms of dyspnea: Secondary | ICD-10-CM | POA: Diagnosis not present

## 2021-11-12 DIAGNOSIS — R0789 Other chest pain: Secondary | ICD-10-CM | POA: Diagnosis not present

## 2021-11-12 DIAGNOSIS — U071 COVID-19: Secondary | ICD-10-CM | POA: Diagnosis not present

## 2021-11-12 DIAGNOSIS — R059 Cough, unspecified: Secondary | ICD-10-CM | POA: Diagnosis not present

## 2022-04-09 DIAGNOSIS — H524 Presbyopia: Secondary | ICD-10-CM | POA: Diagnosis not present

## 2022-04-09 DIAGNOSIS — H35023 Exudative retinopathy, bilateral: Secondary | ICD-10-CM | POA: Diagnosis not present

## 2022-04-13 ENCOUNTER — Encounter: Payer: Self-pay | Admitting: Pulmonary Disease

## 2022-04-15 ENCOUNTER — Ambulatory Visit
Admission: RE | Admit: 2022-04-15 | Discharge: 2022-04-15 | Disposition: A | Discharge status: Home / Self Care | Payer: Medicare HMO | Source: Ambulatory Visit | Attending: Pulmonary Disease | Admitting: Pulmonary Disease

## 2022-04-15 DIAGNOSIS — J479 Bronchiectasis, uncomplicated: Secondary | ICD-10-CM | POA: Diagnosis not present

## 2022-04-15 DIAGNOSIS — D860 Sarcoidosis of lung: Secondary | ICD-10-CM

## 2022-04-15 DIAGNOSIS — K449 Diaphragmatic hernia without obstruction or gangrene: Secondary | ICD-10-CM | POA: Diagnosis not present

## 2022-04-15 DIAGNOSIS — J84112 Idiopathic pulmonary fibrosis: Secondary | ICD-10-CM | POA: Diagnosis not present

## 2022-04-15 DIAGNOSIS — Z0100 Encounter for examination of eyes and vision without abnormal findings: Secondary | ICD-10-CM | POA: Diagnosis not present

## 2022-04-15 DIAGNOSIS — D869 Sarcoidosis, unspecified: Secondary | ICD-10-CM | POA: Diagnosis not present

## 2022-04-28 DIAGNOSIS — R051 Acute cough: Secondary | ICD-10-CM | POA: Diagnosis not present

## 2022-04-28 DIAGNOSIS — R059 Cough, unspecified: Secondary | ICD-10-CM | POA: Diagnosis not present

## 2022-05-20 ENCOUNTER — Ambulatory Visit: Payer: Medicare HMO | Admitting: Pulmonary Disease

## 2022-05-20 ENCOUNTER — Encounter: Payer: Self-pay | Admitting: Pulmonary Disease

## 2022-05-20 VITALS — BP 118/62 | HR 77 | Temp 97.9°F | Ht 62.0 in | Wt 199.0 lb

## 2022-05-20 DIAGNOSIS — D860 Sarcoidosis of lung: Secondary | ICD-10-CM | POA: Diagnosis not present

## 2022-05-20 LAB — COMPREHENSIVE METABOLIC PANEL
ALT: 20 U/L (ref 0–35)
AST: 18 U/L (ref 0–37)
Albumin: 4.2 g/dL (ref 3.5–5.2)
Alkaline Phosphatase: 91 U/L (ref 39–117)
BUN: 15 mg/dL (ref 6–23)
CO2: 30 mEq/L (ref 19–32)
Calcium: 10.3 mg/dL (ref 8.4–10.5)
Chloride: 100 mEq/L (ref 96–112)
Creatinine, Ser: 0.98 mg/dL (ref 0.40–1.20)
GFR: 57.97 mL/min — ABNORMAL LOW (ref >60.00)
Glucose, Bld: 131 mg/dL — ABNORMAL HIGH (ref 70–99)
Potassium: 3.2 mEq/L — ABNORMAL LOW (ref 3.5–5.1)
Sodium: 139 mEq/L (ref 135–145)
Total Bilirubin: 0.7 mg/dL (ref 0.2–1.2)
Total Protein: 7.7 g/dL (ref 6.0–8.3)

## 2022-05-20 LAB — CBC WITH DIFFERENTIAL/PLATELET
Basophils Absolute: 0 10*3/uL (ref 0.0–0.1)
Basophils Relative: 0.3 % (ref 0.0–3.0)
Eosinophils Absolute: 0.2 10*3/uL (ref 0.0–0.7)
Eosinophils Relative: 2.7 % (ref 0.0–5.0)
HCT: 40.5 % (ref 36.0–46.0)
Hemoglobin: 13.6 g/dL (ref 12.0–15.0)
Lymphocytes Relative: 44.3 % (ref 12.0–46.0)
Lymphs Abs: 2.9 10*3/uL (ref 0.7–4.0)
MCHC: 33.6 g/dL (ref 30.0–36.0)
MCV: 89.9 fl (ref 78.0–100.0)
Monocytes Absolute: 0.6 10*3/uL (ref 0.1–1.0)
Monocytes Relative: 8.7 % (ref 3.0–12.0)
Neutro Abs: 2.9 10*3/uL (ref 1.4–7.7)
Neutrophils Relative %: 44 % (ref 43.0–77.0)
Platelets: 191 10*3/uL (ref 150.0–400.0)
RBC: 4.51 Mil/uL (ref 3.87–5.11)
RDW: 15.3 % (ref 11.5–15.5)
WBC: 6.6 10*3/uL (ref 4.0–10.5)

## 2022-05-20 NOTE — Progress Notes (Signed)
Justin Brownfield    [id number]    1950/10/11  Primary Care Physician:Polite, Windy Fast, MD  Referring Physician: Renford Dills, MD [address] [place],  [place] [address]  Chief complaint:  Follow up for sarcoidosis  HPI: Mrs. Lowenthal is a 72 y.o.  with diagnosis of pulmonary sarcoidosis. This was diagnosed by transbronchial biopsy in [place] around 1990. She has been followed by a pulmonologist there and has been on and off prednisone. She is stopped taking the prednisone in 2011 due to an improvement in her symptoms. She's asked her primary care to refer her to a pulmonary specialist for routine follow-up. She does not recall the name of the pulmonologist at [facility].   She has been prescribed Advair and albuterol. But she does not use this as she is asymptomatic. She does not have any extrapulmonary manifestations of sarcoid.   Pets: No pets Occupation: Retired Public house manager Exposures: Has some mold at home.  No hot tub, Jacuzzi or any other exposures. Smoking history: Never smoker Travel history: Originally from [place].  No significant recent travel.  Interim history: Breathing is doing well with no issues.  She had a recent episode of bronchitis and was given a short course of prednisone Here for review of CT scan  Outpatient Encounter Medications as of 05/20/2022  Medication Sig   amLODipine (NORVASC) 5 MG tablet Take 1 tablet (5 mg total) by mouth daily.   calcium carbonate (OSCAL) 1500 (600 Ca) MG TABS tablet Take by mouth 2 (two) times daily with a meal.   hydrocortisone 2.5 % cream APPLY TO AFFECTED AREA TWICE A DAY AS NEEDED   valsartan-hydrochlorothiazide (DIOVAN-HCT) 320-25 MG tablet Take 1 tablet by mouth daily.   ezetimibe (ZETIA) 10 MG tablet Take 1 tablet (10 mg total) by mouth daily.   No facility-administered encounter medications on file as of 05/20/2022.   Physical Exam: Blood pressure 116/64, pulse 64, height 5\' 2"   (1.575 m), weight 194 lb (88 kg), SpO2 97 %. Gen:      No acute distress HEENT:  EOMI, sclera anicteric Neck:     No masses; no thyromegaly Lungs:    Clear to auscultation bilaterally; normal respiratory effort CV:         Regular rate and rhythm; no murmurs Abd:      + bowel sounds; soft, non-tender; no palpable masses, no distension Ext:    No edema; adequate peripheral perfusion Skin:      Warm and dry; no rash Neuro: alert and oriented x 3 Psych: normal mood and affect   Data Reviewed: Imaging CT high-resolution 10/19/2017-pulmonary fibrosis with no basal gradient.  No honeycombing.  Aortic atherosclerosis with left main disease. CT high-resolution 04/06/2020-stable changes of pulmonary fibrosis High resolution CT 05/20/2022-stable changes of pulmonary fibrosis I have reviewed the images personally.  PFTs  06/10/15 FVC 2.08 (90%), FEV1 1.90 [106%], F/F 91, TLC 73%, DLCO 60%. Minimal restriction, moderate diffusion impairment the corrects for alveolar volume.  04/23/2021 FVC 1.84 [85%], FEV1 1.72 [103%], F/F 93, TLC 3.45 [72%], DLCO 12.19 [67%] Minimal restriction, mild diffusion defect  Labs ACE levels 11/19/14 32 (normal) ACE levels 10/04/17 23 (normal)  Hypersensitivity panel 9/80/19-negative ANA, CCP, rheumatoid factor 9/80/19-negative  EKG 11/19/14- NSR, no acute changes D dimer 10/07/15- 0.24 LE dopplers 10/07/15- No DVT.  Assessment:  Pulmonary sarcoidosis. Diagnosis by transbronchial biopsy in 1990. She had previously been on prednisone but has been  off it for the past 5 years.   CT shows pulmonary fibrosis.  This could be HP but her work-up was normal and she does not have significant exposures except for minimal mold exposure We will continue to monitor this.   She is asymptomatic and does not need any treatment at present. Has kept up with annual ophthalmology follow-up Check comprehensive metabolic panel and CBC for monitoring today.  Aortic, coronary  atherosclerosis Follows with cardiology at [facility]          Plan/Recommendations: - CBC, comprehensive metabolic panel. - Follow-up in 1 year with high-resolution CT  Chilton Greathouse MD [facility] 05/20/2022, 1:24 PM  CC: Renford Dills, MD

## 2022-05-20 NOTE — Patient Instructions (Signed)
I am glad you are stable with your breathing The CT scan also shows stable scarring of the lung Will order a follow-up high-res CT and PFTs in 1 year Return to clinic after these tests

## 2022-06-05 DIAGNOSIS — G720 Drug-induced myopathy: Secondary | ICD-10-CM | POA: Diagnosis not present

## 2022-06-05 DIAGNOSIS — I10 Essential (primary) hypertension: Secondary | ICD-10-CM | POA: Diagnosis not present

## 2022-06-05 DIAGNOSIS — D860 Sarcoidosis of lung: Secondary | ICD-10-CM | POA: Diagnosis not present

## 2022-06-05 DIAGNOSIS — Z0000 Encounter for general adult medical examination without abnormal findings: Secondary | ICD-10-CM | POA: Diagnosis not present

## 2022-06-05 DIAGNOSIS — Z5181 Encounter for therapeutic drug level monitoring: Secondary | ICD-10-CM | POA: Diagnosis not present

## 2022-06-05 DIAGNOSIS — Z79899 Other long term (current) drug therapy: Secondary | ICD-10-CM | POA: Diagnosis not present

## 2022-06-05 DIAGNOSIS — I700 Atherosclerosis of aorta: Secondary | ICD-10-CM | POA: Diagnosis not present

## 2022-06-05 DIAGNOSIS — R7309 Other abnormal glucose: Secondary | ICD-10-CM | POA: Diagnosis not present

## 2022-06-05 DIAGNOSIS — E7800 Pure hypercholesterolemia, unspecified: Secondary | ICD-10-CM | POA: Diagnosis not present

## 2022-06-15 ENCOUNTER — Other Ambulatory Visit (INDEPENDENT_AMBULATORY_CARE_PROVIDER_SITE_OTHER): Payer: Medicare HMO

## 2022-06-15 ENCOUNTER — Other Ambulatory Visit: Payer: Self-pay | Admitting: *Deleted

## 2022-06-15 DIAGNOSIS — E876 Hypokalemia: Secondary | ICD-10-CM

## 2022-06-15 LAB — BASIC METABOLIC PANEL
BUN: 18 mg/dL (ref 6–23)
CO2: 29 mEq/L (ref 19–32)
Calcium: 10 mg/dL (ref 8.4–10.5)
Chloride: 100 mEq/L (ref 96–112)
Creatinine, Ser: 1.04 mg/dL (ref 0.40–1.20)
GFR: 53.95 mL/min — ABNORMAL LOW (ref >60.00)
Glucose, Bld: 175 mg/dL — ABNORMAL HIGH (ref 70–99)
Potassium: 3.6 mEq/L (ref 3.5–5.1)
Sodium: 138 mEq/L (ref 135–145)

## 2022-07-22 DIAGNOSIS — Z1239 Encounter for other screening for malignant neoplasm of breast: Secondary | ICD-10-CM | POA: Diagnosis not present

## 2022-07-22 DIAGNOSIS — Z01419 Encounter for gynecological examination (general) (routine) without abnormal findings: Secondary | ICD-10-CM | POA: Diagnosis not present

## 2022-07-22 DIAGNOSIS — Z1231 Encounter for screening mammogram for malignant neoplasm of breast: Secondary | ICD-10-CM | POA: Diagnosis not present

## 2022-07-28 DIAGNOSIS — R0609 Other forms of dyspnea: Secondary | ICD-10-CM | POA: Diagnosis not present

## 2022-07-28 DIAGNOSIS — I2510 Atherosclerotic heart disease of native coronary artery without angina pectoris: Secondary | ICD-10-CM | POA: Diagnosis not present

## 2022-07-28 DIAGNOSIS — I10 Essential (primary) hypertension: Secondary | ICD-10-CM | POA: Diagnosis not present

## 2022-07-28 DIAGNOSIS — R9431 Abnormal electrocardiogram [ECG] [EKG]: Secondary | ICD-10-CM | POA: Diagnosis not present

## 2022-07-28 DIAGNOSIS — I2584 Coronary atherosclerosis due to calcified coronary lesion: Secondary | ICD-10-CM | POA: Diagnosis not present

## 2022-07-28 DIAGNOSIS — Z1330 Encounter for screening examination for mental health and behavioral disorders, unspecified: Secondary | ICD-10-CM | POA: Diagnosis not present

## 2022-10-27 DIAGNOSIS — I700 Atherosclerosis of aorta: Secondary | ICD-10-CM | POA: Diagnosis not present

## 2022-10-27 DIAGNOSIS — E119 Type 2 diabetes mellitus without complications: Secondary | ICD-10-CM | POA: Diagnosis not present

## 2022-10-27 DIAGNOSIS — E1169 Type 2 diabetes mellitus with other specified complication: Secondary | ICD-10-CM | POA: Diagnosis not present

## 2022-10-27 DIAGNOSIS — I2510 Atherosclerotic heart disease of native coronary artery without angina pectoris: Secondary | ICD-10-CM | POA: Diagnosis not present

## 2022-10-27 DIAGNOSIS — E7800 Pure hypercholesterolemia, unspecified: Secondary | ICD-10-CM | POA: Diagnosis not present

## 2022-10-27 DIAGNOSIS — I10 Essential (primary) hypertension: Secondary | ICD-10-CM | POA: Diagnosis not present

## 2022-11-11 DIAGNOSIS — M654 Radial styloid tenosynovitis [de Quervain]: Secondary | ICD-10-CM | POA: Diagnosis not present

## 2023-01-18 DIAGNOSIS — M654 Radial styloid tenosynovitis [de Quervain]: Secondary | ICD-10-CM | POA: Diagnosis not present

## 2023-01-18 DIAGNOSIS — J8410 Pulmonary fibrosis, unspecified: Secondary | ICD-10-CM | POA: Diagnosis not present

## 2023-01-28 DIAGNOSIS — M654 Radial styloid tenosynovitis [de Quervain]: Secondary | ICD-10-CM | POA: Diagnosis not present

## 2023-05-04 ENCOUNTER — Encounter: Payer: Self-pay | Admitting: Pulmonary Disease

## 2023-05-05 ENCOUNTER — Ambulatory Visit
Admission: RE | Admit: 2023-05-05 | Discharge: 2023-05-05 | Disposition: A | Discharge status: Home / Self Care | Source: Ambulatory Visit | Attending: Pulmonary Disease | Admitting: Pulmonary Disease

## 2023-05-05 DIAGNOSIS — J849 Interstitial pulmonary disease, unspecified: Secondary | ICD-10-CM | POA: Diagnosis not present

## 2023-05-05 DIAGNOSIS — D860 Sarcoidosis of lung: Secondary | ICD-10-CM

## 2023-06-01 DIAGNOSIS — R9431 Abnormal electrocardiogram [ECG] [EKG]: Secondary | ICD-10-CM | POA: Diagnosis not present

## 2023-06-01 DIAGNOSIS — R0609 Other forms of dyspnea: Secondary | ICD-10-CM | POA: Diagnosis not present

## 2023-06-01 DIAGNOSIS — E785 Hyperlipidemia, unspecified: Secondary | ICD-10-CM | POA: Diagnosis not present

## 2023-06-01 DIAGNOSIS — I2510 Atherosclerotic heart disease of native coronary artery without angina pectoris: Secondary | ICD-10-CM | POA: Diagnosis not present

## 2023-06-01 DIAGNOSIS — I10 Essential (primary) hypertension: Secondary | ICD-10-CM | POA: Diagnosis not present

## 2023-06-02 DIAGNOSIS — Z0100 Encounter for examination of eyes and vision without abnormal findings: Secondary | ICD-10-CM | POA: Diagnosis not present

## 2023-06-02 DIAGNOSIS — H35033 Hypertensive retinopathy, bilateral: Secondary | ICD-10-CM | POA: Diagnosis not present

## 2023-06-02 DIAGNOSIS — H3540 Unspecified peripheral retinal degeneration: Secondary | ICD-10-CM | POA: Diagnosis not present

## 2023-06-02 DIAGNOSIS — H524 Presbyopia: Secondary | ICD-10-CM | POA: Diagnosis not present

## 2023-06-23 ENCOUNTER — Ambulatory Visit: Payer: Self-pay | Admitting: Pulmonary Disease

## 2023-07-01 DIAGNOSIS — I10 Essential (primary) hypertension: Secondary | ICD-10-CM | POA: Diagnosis not present

## 2023-07-01 DIAGNOSIS — E1169 Type 2 diabetes mellitus with other specified complication: Secondary | ICD-10-CM | POA: Diagnosis not present

## 2023-07-01 DIAGNOSIS — I2510 Atherosclerotic heart disease of native coronary artery without angina pectoris: Secondary | ICD-10-CM | POA: Diagnosis not present

## 2023-07-10 DIAGNOSIS — E7800 Pure hypercholesterolemia, unspecified: Secondary | ICD-10-CM | POA: Diagnosis not present

## 2023-07-10 DIAGNOSIS — E1169 Type 2 diabetes mellitus with other specified complication: Secondary | ICD-10-CM | POA: Diagnosis not present

## 2023-07-10 DIAGNOSIS — I10 Essential (primary) hypertension: Secondary | ICD-10-CM | POA: Diagnosis not present

## 2023-07-10 DIAGNOSIS — I2510 Atherosclerotic heart disease of native coronary artery without angina pectoris: Secondary | ICD-10-CM | POA: Diagnosis not present

## 2023-07-13 DIAGNOSIS — Z0000 Encounter for general adult medical examination without abnormal findings: Secondary | ICD-10-CM | POA: Diagnosis not present

## 2023-07-13 DIAGNOSIS — E7800 Pure hypercholesterolemia, unspecified: Secondary | ICD-10-CM | POA: Diagnosis not present

## 2023-07-13 DIAGNOSIS — D860 Sarcoidosis of lung: Secondary | ICD-10-CM | POA: Diagnosis not present

## 2023-07-13 DIAGNOSIS — I10 Essential (primary) hypertension: Secondary | ICD-10-CM | POA: Diagnosis not present

## 2023-07-13 DIAGNOSIS — I700 Atherosclerosis of aorta: Secondary | ICD-10-CM | POA: Diagnosis not present

## 2023-07-13 DIAGNOSIS — Z1331 Encounter for screening for depression: Secondary | ICD-10-CM | POA: Diagnosis not present

## 2023-07-13 DIAGNOSIS — E1169 Type 2 diabetes mellitus with other specified complication: Secondary | ICD-10-CM | POA: Diagnosis not present

## 2023-07-30 DIAGNOSIS — E1169 Type 2 diabetes mellitus with other specified complication: Secondary | ICD-10-CM | POA: Diagnosis not present

## 2023-07-30 DIAGNOSIS — I10 Essential (primary) hypertension: Secondary | ICD-10-CM | POA: Diagnosis not present

## 2023-07-30 DIAGNOSIS — I2510 Atherosclerotic heart disease of native coronary artery without angina pectoris: Secondary | ICD-10-CM | POA: Diagnosis not present

## 2023-08-09 DIAGNOSIS — E7800 Pure hypercholesterolemia, unspecified: Secondary | ICD-10-CM | POA: Diagnosis not present

## 2023-08-09 DIAGNOSIS — I10 Essential (primary) hypertension: Secondary | ICD-10-CM | POA: Diagnosis not present

## 2023-08-09 DIAGNOSIS — I2510 Atherosclerotic heart disease of native coronary artery without angina pectoris: Secondary | ICD-10-CM | POA: Diagnosis not present

## 2023-08-09 DIAGNOSIS — E1169 Type 2 diabetes mellitus with other specified complication: Secondary | ICD-10-CM | POA: Diagnosis not present

## 2023-08-29 DIAGNOSIS — E1169 Type 2 diabetes mellitus with other specified complication: Secondary | ICD-10-CM | POA: Diagnosis not present

## 2023-08-29 DIAGNOSIS — I10 Essential (primary) hypertension: Secondary | ICD-10-CM | POA: Diagnosis not present

## 2023-08-29 DIAGNOSIS — I2510 Atherosclerotic heart disease of native coronary artery without angina pectoris: Secondary | ICD-10-CM | POA: Diagnosis not present

## 2023-09-09 DIAGNOSIS — E1169 Type 2 diabetes mellitus with other specified complication: Secondary | ICD-10-CM | POA: Diagnosis not present

## 2023-09-09 DIAGNOSIS — I2510 Atherosclerotic heart disease of native coronary artery without angina pectoris: Secondary | ICD-10-CM | POA: Diagnosis not present

## 2023-09-09 DIAGNOSIS — E7800 Pure hypercholesterolemia, unspecified: Secondary | ICD-10-CM | POA: Diagnosis not present

## 2023-09-28 DIAGNOSIS — I2510 Atherosclerotic heart disease of native coronary artery without angina pectoris: Secondary | ICD-10-CM | POA: Diagnosis not present

## 2023-09-28 DIAGNOSIS — I10 Essential (primary) hypertension: Secondary | ICD-10-CM | POA: Diagnosis not present

## 2023-09-28 DIAGNOSIS — E1169 Type 2 diabetes mellitus with other specified complication: Secondary | ICD-10-CM | POA: Diagnosis not present

## 2023-10-10 DIAGNOSIS — I10 Essential (primary) hypertension: Secondary | ICD-10-CM | POA: Diagnosis not present

## 2023-10-10 DIAGNOSIS — E1169 Type 2 diabetes mellitus with other specified complication: Secondary | ICD-10-CM | POA: Diagnosis not present

## 2023-10-10 DIAGNOSIS — I2510 Atherosclerotic heart disease of native coronary artery without angina pectoris: Secondary | ICD-10-CM | POA: Diagnosis not present

## 2023-10-10 DIAGNOSIS — E7800 Pure hypercholesterolemia, unspecified: Secondary | ICD-10-CM | POA: Diagnosis not present

## 2023-10-28 DIAGNOSIS — E1169 Type 2 diabetes mellitus with other specified complication: Secondary | ICD-10-CM | POA: Diagnosis not present

## 2023-10-28 DIAGNOSIS — I10 Essential (primary) hypertension: Secondary | ICD-10-CM | POA: Diagnosis not present

## 2023-10-28 DIAGNOSIS — I2510 Atherosclerotic heart disease of native coronary artery without angina pectoris: Secondary | ICD-10-CM | POA: Diagnosis not present

## 2023-11-03 ENCOUNTER — Ambulatory Visit: Admitting: Pulmonary Disease

## 2023-11-03 ENCOUNTER — Encounter: Payer: Self-pay | Admitting: Pulmonary Disease

## 2023-11-03 VITALS — BP 124/84 | HR 88 | Temp 98.8°F | Ht 61.0 in | Wt 193.0 lb

## 2023-11-03 DIAGNOSIS — D860 Sarcoidosis of lung: Secondary | ICD-10-CM | POA: Diagnosis not present

## 2023-11-03 NOTE — Progress Notes (Signed)
 Terri Fitzgerald    [id number]    1950-08-16  Primary Care Physician:Polite, Tanda, MD  Referring Physician: Rexanne Tanda, MD [address] [place],  [place] [address]  Chief complaint:  Follow up for sarcoidosis  HPI: [name] is a 73 y.o.  with diagnosis of pulmonary sarcoidosis. This was diagnosed by transbronchial biopsy in [place] around 1990. She has been followed by a pulmonologist there and has been on and off prednisone . She is stopped taking the prednisone  in 2011 due to an improvement in her symptoms. She's asked her primary care to refer her to a pulmonary specialist for routine follow-up. She does not recall the name of the pulmonologist at [facility].   She has been prescribed Advair and albuterol . But she does not use this as she is asymptomatic. She does not have any extrapulmonary manifestations of sarcoid.   Interim history: Discussed the use of AI scribe software for clinical note transcription with the patient, who gave verbal consent to proceed.  History of Present Illness Terri Fitzgerald is a 73 year old female with sarcoidosis who presents for routine follow-up.  Pulmonary symptoms - Diagnosed with sarcoidosis in 1990 - No requirement for prednisone  since 2011 - Breathing is stable - No pulmonary complaints  Imaging findings - CT scan performed in 2025 for sarcoidosis surveillance   Relevant pulmonary history Pets: No pets Occupation: Retired Public house manager Exposures: Has some mold at home.  No hot tub, Jacuzzi or any other exposures. Smoking history: Never smoker Travel history: Originally from [place] .  No significant recent travel.  Outpatient Encounter Medications as of 11/03/2023  Medication Sig   amLODipine  (NORVASC ) 5 MG tablet Take 1 tablet (5 mg total) by mouth daily.   calcium  carbonate (OSCAL) 1500 (600 Ca) MG TABS tablet Take by mouth 2 (two) times daily with a meal.   ezetimibe  (ZETIA ) 10  MG tablet Take 1 tablet (10 mg total) by mouth daily.   valsartan -hydrochlorothiazide  (DIOVAN -HCT) 320-25 MG tablet Take 1 tablet by mouth daily.   hydrocortisone 2.5 % cream APPLY TO AFFECTED AREA TWICE A DAY AS NEEDED (Patient not taking: Reported on 11/03/2023)   No facility-administered encounter medications on file as of 11/03/2023.   Vitals:   11/03/23 1016  BP: 124/84  Pulse: 88  Temp: 98.8 F (37.1 C)  Height: 5' 1 (1.549 m)  Weight: 193 lb (87.5 kg)  SpO2: 97%  TempSrc: Oral  BMI (Calculated): 36.49     Physical Exam GEN: No acute distress. CV: Regular rate and rhythm, no murmurs. LUNGS: Clear to auscultation bilaterally, normal respiratory effort. SKIN JOINTS: Warm and dry, no rash.    Data Reviewed: Imaging CT high-resolution 10/19/2017-pulmonary fibrosis with no basal gradient.  No honeycombing.  Aortic atherosclerosis with left main disease. CT high-resolution 04/06/2020-stable changes of pulmonary fibrosis High resolution CT 05/20/2022-stable changes of pulmonary fibrosis High-resolution CT 05/05/2023-stable changes of pulmonary fibrosis I have reviewed the images personally.  PFTs  06/10/15 FVC 2.08 (90%), FEV1 1.90 [106%], F/F 91, TLC 73%, DLCO 60%. Minimal restriction, moderate diffusion impairment the corrects for alveolar volume.  04/23/2021 FVC 1.84 [85%], FEV1 1.72 [103%], F/F 93, TLC 3.45 [72%], DLCO 12.19 [67%] Minimal restriction, mild diffusion defect  Labs ACE levels 11/19/14 32 (normal) ACE levels 10/04/17 23 (normal)  Hypersensitivity panel 9/80/19-negative ANA, CCP, rheumatoid factor 9/80/19-negative  EKG 11/19/14- NSR, no acute changes D dimer 10/07/15- 0.24 LE dopplers 10/07/15- No  DVT. 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 She had previously been on prednisone  but has been off it for many years  CT shows stable pulmonary fibrosis.  This could be HP but her  work-up was normal and she does not have significant exposures except for minimal mold exposure Recent CT in 2025 shows changes consistent with chronic sarcoidosis, with no active process. Breathing is stable with only mild changes on previous lung function tests. The condition is considered in remission with some scarring left in the lungs, which is not deemed significant. - Repeat lung function test in one year. - No need for CT scan next year unless symptoms change.   Aortic, coronary atherosclerosis Follows with cardiology at [facility]          Plan/Recommendations: - Follow-up in 1 year with PFTs  Lonna Coder MD [facility] 11/03/2023, 10:28 AM  CC: Rexanne Ingle, MD

## 2023-11-03 NOTE — Patient Instructions (Addendum)
 I am glad you are doing well with your breathing Your CT shows stable findings of prior sarcoidosis Continue to maintain weight loss and exercise Will follow-up with you in 1 year with pulmonary function test.  No need to repeat CT next year if your symptoms are stable.

## 2023-11-09 DIAGNOSIS — I10 Essential (primary) hypertension: Secondary | ICD-10-CM | POA: Diagnosis not present

## 2023-11-09 DIAGNOSIS — I2510 Atherosclerotic heart disease of native coronary artery without angina pectoris: Secondary | ICD-10-CM | POA: Diagnosis not present

## 2023-11-09 DIAGNOSIS — E7800 Pure hypercholesterolemia, unspecified: Secondary | ICD-10-CM | POA: Diagnosis not present

## 2023-11-09 DIAGNOSIS — E1169 Type 2 diabetes mellitus with other specified complication: Secondary | ICD-10-CM | POA: Diagnosis not present

## 2023-11-27 DIAGNOSIS — I10 Essential (primary) hypertension: Secondary | ICD-10-CM | POA: Diagnosis not present

## 2023-11-27 DIAGNOSIS — I2510 Atherosclerotic heart disease of native coronary artery without angina pectoris: Secondary | ICD-10-CM | POA: Diagnosis not present

## 2023-11-27 DIAGNOSIS — E1169 Type 2 diabetes mellitus with other specified complication: Secondary | ICD-10-CM | POA: Diagnosis not present

## 2023-12-10 DIAGNOSIS — E1169 Type 2 diabetes mellitus with other specified complication: Secondary | ICD-10-CM | POA: Diagnosis not present

## 2023-12-10 DIAGNOSIS — I2510 Atherosclerotic heart disease of native coronary artery without angina pectoris: Secondary | ICD-10-CM | POA: Diagnosis not present

## 2023-12-10 DIAGNOSIS — E7800 Pure hypercholesterolemia, unspecified: Secondary | ICD-10-CM | POA: Diagnosis not present

## 2023-12-10 DIAGNOSIS — I10 Essential (primary) hypertension: Secondary | ICD-10-CM | POA: Diagnosis not present

## 2023-12-27 DIAGNOSIS — I10 Essential (primary) hypertension: Secondary | ICD-10-CM | POA: Diagnosis not present

## 2023-12-27 DIAGNOSIS — E1169 Type 2 diabetes mellitus with other specified complication: Secondary | ICD-10-CM | POA: Diagnosis not present

## 2023-12-27 DIAGNOSIS — I2510 Atherosclerotic heart disease of native coronary artery without angina pectoris: Secondary | ICD-10-CM | POA: Diagnosis not present

## 2024-01-13 DIAGNOSIS — I700 Atherosclerosis of aorta: Secondary | ICD-10-CM | POA: Diagnosis not present

## 2024-01-13 DIAGNOSIS — D860 Sarcoidosis of lung: Secondary | ICD-10-CM | POA: Diagnosis not present

## 2024-01-13 DIAGNOSIS — E7800 Pure hypercholesterolemia, unspecified: Secondary | ICD-10-CM | POA: Diagnosis not present

## 2024-01-13 DIAGNOSIS — I2510 Atherosclerotic heart disease of native coronary artery without angina pectoris: Secondary | ICD-10-CM | POA: Diagnosis not present

## 2024-01-13 DIAGNOSIS — R7303 Prediabetes: Secondary | ICD-10-CM | POA: Diagnosis not present

## 2024-01-13 DIAGNOSIS — I10 Essential (primary) hypertension: Secondary | ICD-10-CM | POA: Diagnosis not present
